# Patient Record
Sex: Male | Born: 1980
Health system: Southern US, Community
[De-identification: ages and names within clinical notes are randomized; demographics above are authoritative.]

## PROBLEM LIST (undated history)

## (undated) DIAGNOSIS — M109 Gout, unspecified: Secondary | ICD-10-CM

## (undated) DIAGNOSIS — I10 Essential (primary) hypertension: Secondary | ICD-10-CM

## (undated) DIAGNOSIS — E669 Obesity, unspecified: Secondary | ICD-10-CM

## (undated) HISTORY — PX: FRACTURE SURGERY: SHX138

## (undated) HISTORY — DX: Obesity, unspecified: E66.9

## (undated) HISTORY — DX: Gout, unspecified: M10.9

## (undated) HISTORY — DX: Essential (primary) hypertension: I10

---

## 2017-01-01 ENCOUNTER — Emergency Department (INDEPENDENT_AMBULATORY_CARE_PROVIDER_SITE_OTHER): Payer: BLUE CROSS/BLUE SHIELD

## 2017-01-01 ENCOUNTER — Emergency Department (INDEPENDENT_AMBULATORY_CARE_PROVIDER_SITE_OTHER)
Admission: EM | Admit: 2017-01-01 | Discharge: 2017-01-01 | Disposition: A | Discharge status: Home / Self Care | Payer: BLUE CROSS/BLUE SHIELD | Source: Home / Self Care | Attending: Family Medicine | Admitting: Family Medicine

## 2017-01-01 DIAGNOSIS — M79672 Pain in left foot: Secondary | ICD-10-CM | POA: Diagnosis not present

## 2017-01-01 LAB — URIC ACID: Uric Acid, Serum: 8.7 mg/dL — ABNORMAL HIGH (ref 4.0–8.0)

## 2017-01-01 MED ORDER — PREDNISONE 20 MG PO TABS: 20.0000 mg | ORAL_TABLET | Freq: Two times a day (BID) | ORAL | 0 refills | Status: DC

## 2017-01-01 NOTE — ED Provider Notes (Signed)
[facility]    CSN: [id number] Arrival date & time: 01/01/17  4098     History   Chief Complaint Chief Complaint  Patient presents with  . Foot Pain    left    HPI Andre Bradley is a 36 y.o. male.   Yesterday morning patient developed pain in left distal foot that has persisted.  He had been doing yard work the day before, but recalls no injury, and had no pain in his foot after doing yard work.  No history of gout.  No family history of gout.  He tried ibuprofen 400mg  twice daily with mild brief improvement.   The history is provided by the patient.  Foot Pain  This is a new problem. The current episode started yesterday. The problem occurs constantly. The problem has not changed since onset.Associated symptoms comments: none. The symptoms are aggravated by walking. Nothing relieves the symptoms. Treatments tried: Ibuprofen. The treatment provided mild relief.    History reviewed. No pertinent past medical history.  There are no active problems to display for this patient.   Past Surgical History:  Procedure Laterality Date  . FRACTURE SURGERY     left humerous, 1999       Home Medications    Prior to Admission medications   Not on File    Family History History reviewed. No pertinent family history.  Social History Social History  Substance Use Topics  . Smoking status: Current Every Day Smoker    Packs/day: 1.00  . Smokeless tobacco: Not on file  . Alcohol use Yes     Allergies   Patient has no known allergies.   Review of Systems Review of Systems  Constitutional: Negative for chills, fatigue and fever.  All other systems reviewed and are negative.    Physical Exam Triage Vital Signs ED Triage Vitals  Enc Vitals Group     BP 01/01/17 0849 (!) 175/112     Pulse Rate 01/01/17 0849 80     Resp --      Temp 01/01/17 0849 98.1 F (36.7 C)     Temp Source 01/01/17 0849 Oral     SpO2 01/01/17 0849 96 %     Weight 01/01/17 0850  239 lb (108.4 kg)     Height 01/01/17 0850 5\' 10"  (1.778 m)     Head Circumference --      Peak Flow --      Pain Score 01/01/17 0852 2     Pain Loc --      Pain Edu? --      Excl. in GC? --    No data found.   Updated Vital Signs BP (!) 175/112 (BP Location: Left Arm)   Pulse 80   Temp 98.1 F (36.7 C) (Oral)   Ht 5\' 10"  (1.778 m)   Wt 239 lb (108.4 kg)   SpO2 96%   BMI 34.29 kg/m   Visual Acuity Right Eye Distance:   Left Eye Distance:   Bilateral Distance:    Right Eye Near:   Left Eye Near:    Bilateral Near:     Physical Exam  Constitutional: He appears well-developed and well-nourished. No distress.  HENT:  Head: Normocephalic.  Eyes: Pupils are equal, round, and reactive to light.  Cardiovascular: Normal rate.   Pulmonary/Chest: Effort normal.  Musculoskeletal: He exhibits no edema.       Left foot: There is tenderness and bony tenderness. There is normal range of motion, no swelling,  normal capillary refill, no crepitus, no deformity and no laceration.       Feet:  Left foot has distinct tenderness to palpation over the first MTP joint extending into the distal first metatarsal.  No swelling.  Mild erythema and warmth dorsally.  No tenderness over extensor tendons.  Neurological: He is alert.  Skin: Skin is warm and dry.  Nursing note and vitals reviewed.    UC Treatments / Results  Labs (all labs ordered are listed, but only abnormal results are displayed) Labs Reviewed - No data to display  EKG  EKG Interpretation None       Radiology Dg Foot Complete Left  Result Date: 01/01/2017 CLINICAL DATA:  First metatarsal pain since yesterday. No known injury. Initial encounter. EXAM: LEFT FOOT - COMPLETE 3+ VIEW COMPARISON:  None. FINDINGS: There is no evidence of fracture or dislocation. There is no evidence of arthropathy or other focal bone abnormality. Small calcaneal spurs are seen Soft tissues are unremarkable. IMPRESSION: Negative exam.  Electronically Signed   By: Drusilla Kannerhomas  Dalessio M.D.   On: 01/01/2017 09:48    Procedures Procedures (including critical care time)  Medications Ordered in UC Medications - No data to display   Initial Impression / Assessment and Plan / UC Course  I have reviewed the triage vital signs and the nursing notes.  Pertinent labs & imaging results that were available during my care of the patient were reviewed by me and considered in my medical decision making (see chart for details).    Suspect acute gout.  Uric acid pending. Begin prednisone burst. Increase fluid intake.  May take Tylenol as needed for pain. Followup with Dr. Rodney Langtonhomas Thekkekandam or Dr. Clementeen GrahamEvan Corey ([facility]) if not improving about two weeks, or if symptoms recur. Note elevated BP today.  Recommend monitor blood pressure more frequently at different times of day and record on a calendar.   Followup with Family Doctor if BP remains elevated.    Final Clinical Impressions(s) / UC Diagnoses   Final diagnoses:  None    New Prescriptions New Prescriptions   No medications on file     Lattie HawStephen A Beese, MD 01/01/17 1303

## 2017-01-01 NOTE — Discharge Instructions (Signed)
Increase fluid intake.  May take Tylenol as needed for pain. 

## 2017-01-01 NOTE — ED Triage Notes (Signed)
Doing yard work Saturday, doesn't recall an injury, woke up Sunday morning with swelling, and pain in foot.  Woke with swelling and pain this am also.

## 2017-01-03 ENCOUNTER — Telehealth: Payer: Self-pay | Admitting: *Deleted

## 2017-01-03 NOTE — Telephone Encounter (Signed)
Called LM with Uric Acid results, complete Prednisone, and call back if he as any questions or concerns.

## 2017-03-30 ENCOUNTER — Encounter: Payer: Self-pay | Admitting: *Deleted

## 2017-03-30 ENCOUNTER — Emergency Department (INDEPENDENT_AMBULATORY_CARE_PROVIDER_SITE_OTHER)
Admission: EM | Admit: 2017-03-30 | Discharge: 2017-03-30 | Disposition: A | Discharge status: Home / Self Care | Payer: BLUE CROSS/BLUE SHIELD | Source: Home / Self Care | Attending: Family Medicine | Admitting: Family Medicine

## 2017-03-30 DIAGNOSIS — L739 Follicular disorder, unspecified: Secondary | ICD-10-CM

## 2017-03-30 MED ORDER — PREDNISONE 20 MG PO TABS: 20.0000 mg | ORAL_TABLET | Freq: Two times a day (BID) | ORAL | 0 refills | Status: DC

## 2017-03-30 MED ORDER — DOXYCYCLINE HYCLATE 100 MG PO CAPS: 100.0000 mg | ORAL_CAPSULE | Freq: Two times a day (BID) | ORAL | 0 refills | Status: DC

## 2017-03-30 NOTE — ED Triage Notes (Signed)
Patient c/o 3 days of pruritic rash to neck and chest now on legs. He did change his laundry detergent 4 days ago.

## 2017-03-30 NOTE — ED Provider Notes (Signed)
[facility]    CSN: [id number] Arrival date & time: 03/30/17  0802     History   Chief Complaint Chief Complaint  Patient presents with  . Rash    HPI Hart RobinsonsJoshua Pless is a 36 y.o. male.   Patient awoke 4 days ago and discovered a rash on his neck, chest, upper back, and a few spots on his upper legs.  The rash does not itch.  He feels well otherwise.  No recent hot tub use.   The history is provided by the patient.  Rash  Location: trunk, anterior neck and upper legs. Quality: dryness and redness   Quality: not blistering, not bruising, not burning, not draining, not itchy, not painful, not peeling, not scaling, not swelling and not weeping   Severity:  Mild Onset quality:  Sudden Duration:  4 days Timing:  Constant Progression:  Unchanged Chronicity:  New Context: new detergent/soap   Context: not animal contact, not chemical exposure, not exposure to similar rash, not food, not hot tub use, not insect bite/sting, not medications, not nuts, not plant contact, not pollen, not sick contacts and not sun exposure   Relieved by:  Nothing Worsened by:  Nothing Ineffective treatments:  None tried Associated symptoms: no abdominal pain, no diarrhea, no fatigue, no fever, no headaches, no hoarse voice, no induration, no joint pain, no myalgias, no nausea, no sore throat and no URI     History reviewed. No pertinent past medical history.  There are no active problems to display for this patient.   Past Surgical History:  Procedure Laterality Date  . FRACTURE SURGERY     left humerous, 1999       Home Medications    Prior to Admission medications   Medication Sig Start Date End Date Taking? Authorizing Provider  doxycycline (VIBRAMYCIN) 100 MG capsule Take 1 capsule (100 mg total) by mouth 2 (two) times daily. Take with food. 03/30/17   Lattie HawBeese, Montae Stager A, MD  predniSONE (DELTASONE) 20 MG tablet Take 1 tablet (20 mg total) by mouth 2 (two) times daily. Take with  food. 03/30/17   Lattie HawBeese, Lateef Juncaj A, MD    Family History History reviewed. No pertinent family history.  Social History Social History  Substance Use Topics  . Smoking status: Current Every Day Smoker    Packs/day: 1.00  . Smokeless tobacco: Never Used  . Alcohol use Yes     Allergies   Morphine and related   Review of Systems Review of Systems  Constitutional: Negative for fatigue and fever.  HENT: Negative for hoarse voice and sore throat.   Gastrointestinal: Negative for abdominal pain, diarrhea and nausea.  Musculoskeletal: Negative for arthralgias and myalgias.  Skin: Positive for rash.  Neurological: Negative for headaches.  All other systems reviewed and are negative.    Physical Exam Triage Vital Signs ED Triage Vitals  Enc Vitals Group     BP 03/30/17 0815 (!) 170/105     Pulse Rate 03/30/17 0815 90     Resp --      Temp 03/30/17 0815 98.4 F (36.9 C)     Temp Source 03/30/17 0815 Oral     SpO2 03/30/17 0815 97 %     Weight 03/30/17 0816 235 lb (106.6 kg)     Height --      Head Circumference --      Peak Flow --      Pain Score 03/30/17 0816 0     Pain Loc --  Pain Edu? --      Excl. in GC? --    No data found.   Updated Vital Signs BP (!) 167/107 (BP Location: Right Arm)   Pulse 90   Temp 98.4 F (36.9 C) (Oral)   Wt 235 lb (106.6 kg)   SpO2 97%   BMI 33.72 kg/m   Visual Acuity Right Eye Distance:   Left Eye Distance:   Bilateral Distance:    Right Eye Near:   Left Eye Near:    Bilateral Near:     Physical Exam  Constitutional: He appears well-developed and well-nourished. No distress.  HENT:  Head: Normocephalic.  Right Ear: External ear normal.  Left Ear: External ear normal.  Nose: Nose normal.  Mouth/Throat: Oropharynx is clear and moist.  Eyes: Conjunctivae are normal. Pupils are equal, round, and reactive to light.  Neck: Neck supple.  Cardiovascular: Normal heart sounds.   Pulmonary/Chest: Breath sounds normal.    Musculoskeletal: He exhibits no edema.  Lymphadenopathy:    He has no cervical adenopathy.  Neurological: He is alert.  Skin: Skin is warm and dry. Rash noted. Rash is pustular.     Anterior/posterior neck, upper torso, and anterior thighs have numerous tiny pustules centered on hair follicles.  Some lesions are suggestive of miliaria rubra.  Nursing note and vitals reviewed.    UC Treatments / Results  Labs (all labs ordered are listed, but only abnormal results are displayed) Labs Reviewed - No data to display  EKG  EKG Interpretation None       Radiology No results found.  Procedures Procedures (including critical care time)  Medications Ordered in UC Medications - No data to display   Initial Impression / Assessment and Plan / UC Course  I have reviewed the triage vital signs and the nursing notes.  Pertinent labs & imaging results that were available during my care of the patient were reviewed by me and considered in my medical decision making (see chart for details).    Begin doxycycline 100mg  BID for staph coverage, and prednisone burst. If rash itches, may take Benadryl at bedtime. Followup with dermatologist if not improved 7 to 10 days.  Note hypertension today. Advised to monitor blood pressure more frequently at different times of day and record on a calendar. Followup with Family Doctor if BP remains elevated.     Final Clinical Impressions(s) / UC Diagnoses   Final diagnoses:  Folliculitis    New Prescriptions Discharge Medication List as of 03/30/2017  8:33 AM    START taking these medications   Details  doxycycline (VIBRAMYCIN) 100 MG capsule Take 1 capsule (100 mg total) by mouth 2 (two) times daily. Take with food., Starting Fri 03/30/2017, Normal    predniSONE (DELTASONE) 20 MG tablet Take 1 tablet (20 mg total) by mouth 2 (two) times daily. Take with food., Starting Fri 03/30/2017, Normal         Lattie Haw, MD 03/30/17  [id number]

## 2017-03-30 NOTE — Discharge Instructions (Signed)
If rash itches, may take Benadryl at bedtime. 

## 2017-09-24 IMAGING — DX DG FOOT COMPLETE 3+V*L*
3 series · 3 of 3 positions shown · non-contrast
Comparison: None.

CLINICAL DATA: First metatarsal pain since yesterday. No known
injury. Initial encounter.

EXAM:
LEFT FOOT - COMPLETE 3+ VIEW

[foot ap]
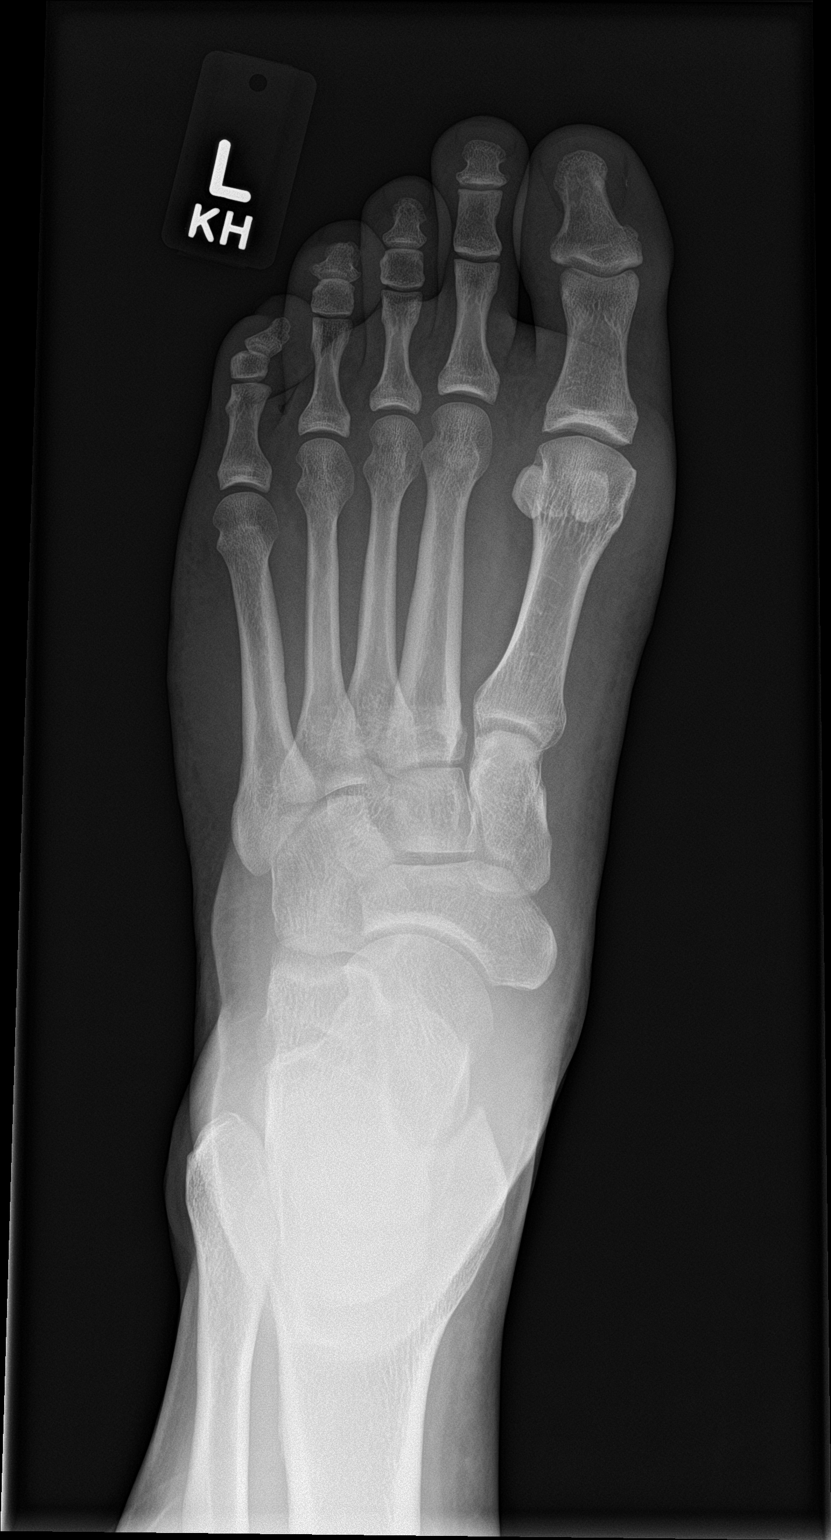

[foot obl]
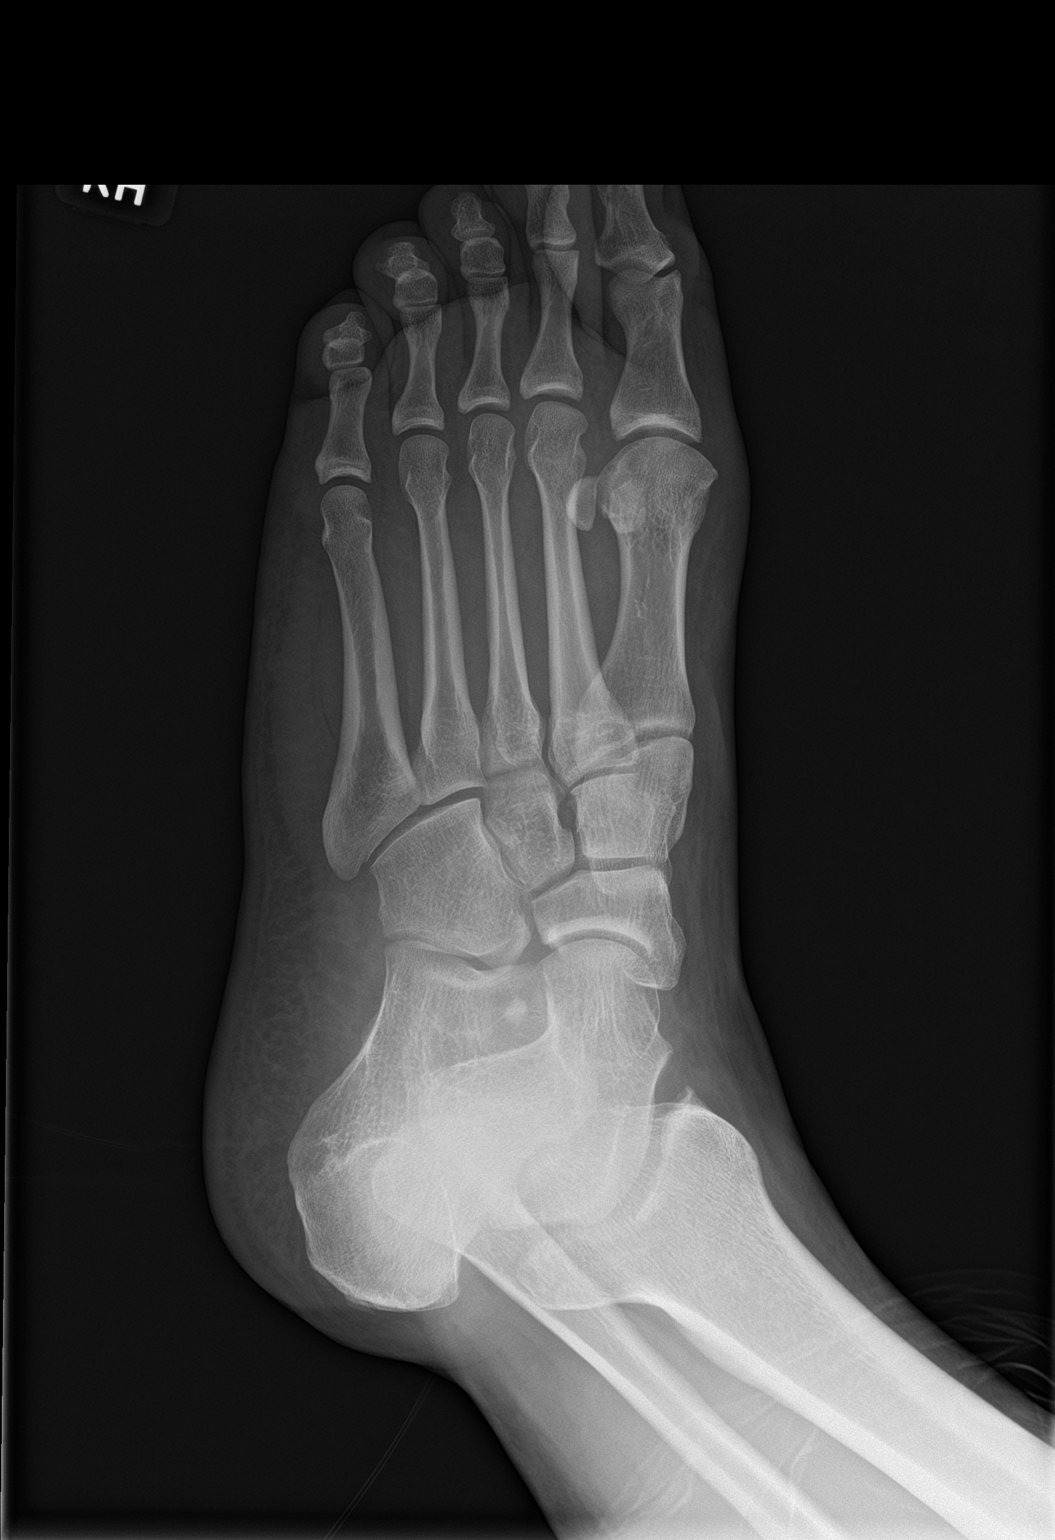

[foot lat]
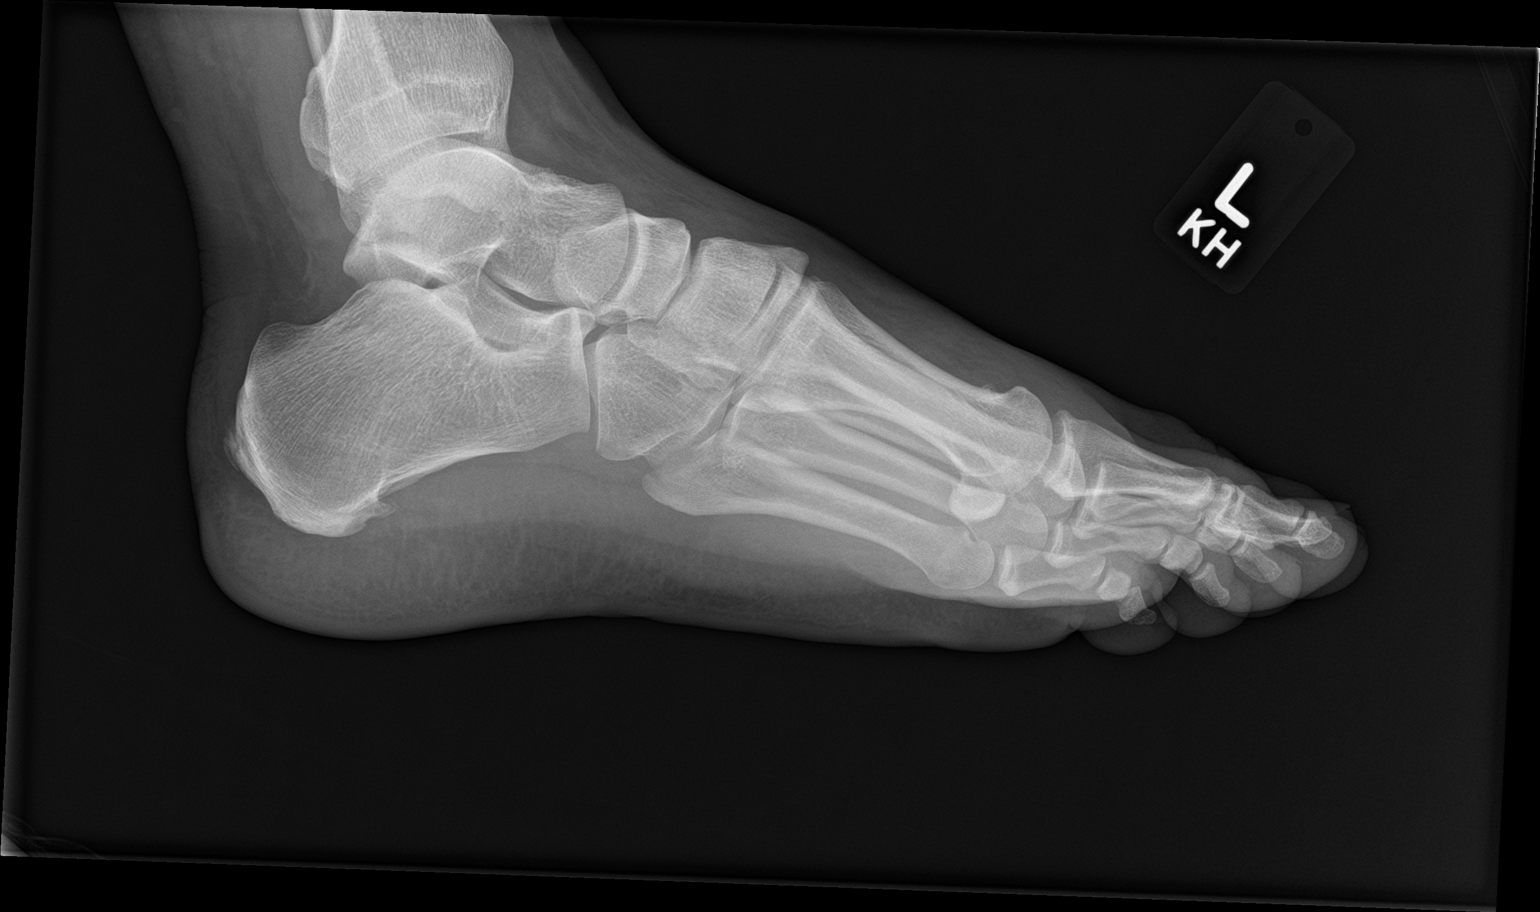

[3 of 3 positions shown; findings below may reference images not displayed]

FINDINGS: There is no evidence of fracture or dislocation. There is no
evidence of arthropathy or other focal bone abnormality. Small
calcaneal spurs are seen Soft tissues are unremarkable.
IMPRESSION: Negative exam.

## 2018-07-18 ENCOUNTER — Ambulatory Visit: Payer: BLUE CROSS/BLUE SHIELD | Admitting: Osteopathic Medicine

## 2018-07-23 ENCOUNTER — Encounter: Payer: Self-pay | Admitting: Physician Assistant

## 2018-07-23 ENCOUNTER — Ambulatory Visit (INDEPENDENT_AMBULATORY_CARE_PROVIDER_SITE_OTHER): Payer: BLUE CROSS/BLUE SHIELD | Admitting: Physician Assistant

## 2018-07-23 VITALS — BP 162/113 | HR 91 | Ht 71.0 in | Wt 243.0 lb

## 2018-07-23 DIAGNOSIS — Z0000 Encounter for general adult medical examination without abnormal findings: Secondary | ICD-10-CM | POA: Diagnosis not present

## 2018-07-23 DIAGNOSIS — Z1389 Encounter for screening for other disorder: Secondary | ICD-10-CM

## 2018-07-23 DIAGNOSIS — Z23 Encounter for immunization: Secondary | ICD-10-CM | POA: Diagnosis not present

## 2018-07-23 DIAGNOSIS — I10 Essential (primary) hypertension: Secondary | ICD-10-CM | POA: Diagnosis not present

## 2018-07-23 DIAGNOSIS — R809 Proteinuria, unspecified: Secondary | ICD-10-CM

## 2018-07-23 DIAGNOSIS — E782 Mixed hyperlipidemia: Secondary | ICD-10-CM

## 2018-07-23 DIAGNOSIS — Z8739 Personal history of other diseases of the musculoskeletal system and connective tissue: Secondary | ICD-10-CM

## 2018-07-23 DIAGNOSIS — Z13220 Encounter for screening for lipoid disorders: Secondary | ICD-10-CM | POA: Diagnosis not present

## 2018-07-23 DIAGNOSIS — E1169 Type 2 diabetes mellitus with other specified complication: Secondary | ICD-10-CM

## 2018-07-23 DIAGNOSIS — Z7689 Persons encountering health services in other specified circumstances: Secondary | ICD-10-CM

## 2018-07-23 DIAGNOSIS — Z131 Encounter for screening for diabetes mellitus: Secondary | ICD-10-CM

## 2018-07-23 DIAGNOSIS — E1165 Type 2 diabetes mellitus with hyperglycemia: Secondary | ICD-10-CM

## 2018-07-23 DIAGNOSIS — E66811 Obesity, class 1: Secondary | ICD-10-CM

## 2018-07-23 DIAGNOSIS — F17200 Nicotine dependence, unspecified, uncomplicated: Secondary | ICD-10-CM | POA: Diagnosis not present

## 2018-07-23 DIAGNOSIS — E6609 Other obesity due to excess calories: Secondary | ICD-10-CM | POA: Insufficient documentation

## 2018-07-23 DIAGNOSIS — R030 Elevated blood-pressure reading, without diagnosis of hypertension: Secondary | ICD-10-CM | POA: Insufficient documentation

## 2018-07-23 DIAGNOSIS — F17210 Nicotine dependence, cigarettes, uncomplicated: Secondary | ICD-10-CM

## 2018-07-23 DIAGNOSIS — Z130 Encounter for screening for diseases of the blood and blood-forming organs and certain disorders involving the immune mechanism: Secondary | ICD-10-CM | POA: Diagnosis not present

## 2018-07-23 MED ORDER — NICOTINE 21 MG/24HR TD PT24: 21.0000 mg | MEDICATED_PATCH | Freq: Every day | TRANSDERMAL | 1 refills | Status: DC

## 2018-07-23 NOTE — Progress Notes (Signed)
HPI:                                                                Andre Bradley is a 37 y.o. male who presents to [name]: [facility] today to establish care  Current concerns: general wellness  Reports he has been traveling a lot for work for several years and has not had the healthiest diet. He has gained weight and does not exercise regularly. He is interested in making lifestyle changes and wants to be cleared to start aerobic exercise. He has decreased exercise tolerance related to sedentary lifestyle, but is able to perform 4 METS without symptoms. Denies chest pain with exertion or syncope. No family hx of sudden cardiac death.  He is interested in smoking cessation. Currently smokes between 3 and 14 cigarettes on any given day. He was able to quit successfully using nicotine patch several years ago for 2 years. Cites increased travel/stress as reason for relapsing.   Depression screen PHQ 2/9 07/23/2018  Decreased Interest 0  Down, Depressed, Hopeless 0  PHQ - 2 Score 0    No flowsheet data found.    No past medical history on file. Past Surgical History:  Procedure Laterality Date  . FRACTURE SURGERY     left humerous, 1999   Social History   Tobacco Use  . Smoking status: Current Every Day Smoker    Packs/day: 1.00  . Smokeless tobacco: Never Used  Substance Use Topics  . Alcohol use: Yes   family history is not on file.    ROS: negative except as noted in the HPI  Medications: No current outpatient medications on file.   No current facility-administered medications for this visit.    Allergies  Allergen Reactions  . Morphine And Related Other (See Comments)       Objective:  BP (!) 171/97   Pulse 91   Ht 5\' 11"  (1.803 m)   Wt 243 lb (110.2 kg)   BMI 33.89 kg/m  Gen:  alert, not ill-appearing, no distress, appropriate for age, obese male HEENT: head normocephalic without obvious abnormality, conjunctiva  and cornea clear, trachea midline, no carotid bruit Pulm: Normal work of breathing, normal phonation, clear to auscultation bilaterally, no wheezes, rales or rhonchi CV: Normal rate, regular rhythm, s1 and s2 distinct, no murmurs, clicks or rubs  Neuro: alert and oriented x 3, no tremor MSK: extremities atraumatic, normal gait and station, no peripheral edema Skin: intact, no rashes on exposed skin, no jaundice, no cyanosis Psych: well-groomed, cooperative, good eye contact, euthymic mood, affect mood-congruent, speech is articulate, and thought processes clear and goal-directed  BP Readings from Last 3 Encounters:  07/23/18 (!) 162/113  03/30/17 (!) 167/107  01/01/17 (!) 175/112   Wt Readings from Last 3 Encounters:  07/23/18 243 lb (110.2 kg)  03/30/17 235 lb (106.6 kg)  01/01/17 239 lb (108.4 kg)     No results found for this or any previous visit (from the past 72 hour(s)). No results found.    Assessment and Plan: 37 y.o. male with   .Barrett was seen today for establish care.  Diagnoses and all orders for this visit:  Encounter to establish care  History of gout  Tobacco use disorder -  CBC  Uncontrolled stage 2 hypertension -     Comprehensive metabolic panel  Class 1 obesity due to excess calories in adult, unspecified BMI, unspecified whether serious comorbidity present -     Lipid Panel w/reflex Direct LDL  Screening for blood disease -     CBC -     Comprehensive metabolic panel  Screening for blood or protein in urine -     Urinalysis, Routine w reflex microscopic  Screening for diabetes mellitus -     Hemoglobin A1c  Screening for lipid disorders -     Lipid Panel w/reflex Direct LDL  Cigarette nicotine dependence without complication -     nicotine (NICODERM CQ) 21 mg/24hr patch; Place 1 patch (21 mg total) onto the skin daily.  Need for immunization against influenza -     Flu Vaccine QUAD 36+ mos IM  - Personally reviewed PMH, PSH,  PFH, medications, allergies, HM - Age-appropriate cancer screening: n/a - Influenza given today - Tdap UTD per patient - PHQ2 negative  Stage 2 HTN - asymptomatic - declines antihypertensive medication. Risks, benefits discussed - will give him 3 months for therapeutic lifestyle changes, but strongly recommend antihypertensive medication if BP remains elevated - counseled on DASH eating plan and regular aerobic exercise - patient to monitor and log BP's at home  Obesity - counseled on weight loss through decreased caloric intake, 2000 calorie moderate protein diet, and increase aerobic exercise  Smoking cessation The patient was counseled on the dangers of tobacco use, and was advised to quit and referred to a tobacco cessation program.  Reviewed strategies to maximize success, including written materials, local smoking cessation programs ( [place], [facility]) and pharmacotherapy (Nicoderm).  Patient education and anticipatory guidance given Patient agrees with treatment plan Follow-up in 3 months or sooner as needed if symptoms worsen or fail to improve  Levonne Hubert PA-C

## 2018-07-23 NOTE — Patient Instructions (Addendum)
For your blood pressure: - Goal <130/80 - monitor and log blood pressures at home - check around the same time each day in a relaxed setting - Limit salt to <2500 mg/day - Follow DASH eating plan - limit alcohol to 2 standard drinks per day for men and 1 per day for women - avoid tobacco products - weight loss: 7% of current body weight - follow-up every 6 months for your blood pressure   For weight loss: 2000 calories / day Use MyFitnessPal app to log daily intake of food, drink and exercise.  Make snacks high in protein (>10g) and low in carbs (<15g). Stay away from high sugar drinks and foods.  Consider getting a Education officer, museum or Garmin to track daily steps.  Aim for 10,000 steps per day.  Stay active! Try to work out 3-4 days per week for 30-45 minutes. Aim for 64 oz of water each day. Work on stress reduction, meal planning and 8 hours of sleep at night. Don't skip meals. Can substitute a protein drink for one meal per day    DASH Eating Plan DASH stands for "Dietary Approaches to Stop Hypertension." The DASH eating plan is a healthy eating plan that has been shown to reduce high blood pressure (hypertension). It may also reduce your risk for type 2 diabetes, heart disease, and stroke. The DASH eating plan may also help with weight loss. What are tips for following this plan? General guidelines  Avoid eating more than 2,300 mg (milligrams) of salt (sodium) a day. If you have hypertension, you may need to reduce your sodium intake to 1,500 mg a day.  Limit alcohol intake to no more than 1 drink a day for nonpregnant women and 2 drinks a day for men. One drink equals 12 oz of beer, 5 oz of wine, or 1 oz of hard liquor.  Work with your health care provider to maintain a healthy body weight or to lose weight. Ask what an ideal weight is for you.  Get at least 30 minutes of exercise that causes your heart to beat faster (aerobic exercise) most days of the week. Activities may include  walking, swimming, or biking.  Work with your health care provider or diet and nutrition specialist (dietitian) to adjust your eating plan to your individual calorie needs. Reading food labels  Check food labels for the amount of sodium per serving. Choose foods with less than 5 percent of the Daily Value of sodium. Generally, foods with less than 300 mg of sodium per serving fit into this eating plan.  To find whole grains, look for the word "whole" as the first word in the ingredient list. Shopping  Buy products labeled as "low-sodium" or "no salt added."  Buy fresh foods. Avoid canned foods and premade or frozen meals. Cooking  Avoid adding salt when cooking. Use salt-free seasonings or herbs instead of table salt or sea salt. Check with your health care provider or pharmacist before using salt substitutes.  Do not fry foods. Cook foods using healthy methods such as baking, boiling, grilling, and broiling instead.  Cook with heart-healthy oils, such as olive, canola, soybean, or sunflower oil. Meal planning   Eat a balanced diet that includes: ? 5 or more servings of fruits and vegetables each day. At each meal, try to fill half of your plate with fruits and vegetables. ? Up to 6-8 servings of whole grains each day. ? Less than 6 oz of lean meat, poultry, or fish each  day. A 3-oz serving of meat is about the same size as a deck of cards. One egg equals 1 oz. ? 2 servings of low-fat dairy each day. ? A serving of nuts, seeds, or beans 5 times each week. ? Heart-healthy fats. Healthy fats called Omega-3 fatty acids are found in foods such as flaxseeds and coldwater fish, like sardines, salmon, and mackerel.  Limit how much you eat of the following: ? Canned or prepackaged foods. ? Food that is high in trans fat, such as fried foods. ? Food that is high in saturated fat, such as fatty meat. ? Sweets, desserts, sugary drinks, and other foods with added sugar. ? Full-fat dairy  products.  Do not salt foods before eating.  Try to eat at least 2 vegetarian meals each week.  Eat more home-cooked food and less restaurant, buffet, and fast food.  When eating at a restaurant, ask that your food be prepared with less salt or no salt, if possible. What foods are recommended? The items listed may not be a complete list. Talk with your dietitian about what dietary choices are best for you. Grains Whole-grain or whole-wheat bread. Whole-grain or whole-wheat pasta. Brown rice. [place] Morrow. Bulgur. Whole-grain and low-sodium cereals. Pita bread. Low-fat, low-sodium crackers. Whole-wheat flour tortillas. Vegetables Fresh or frozen vegetables (raw, steamed, roasted, or grilled). Low-sodium or reduced-sodium tomato and vegetable juice. Low-sodium or reduced-sodium tomato sauce and tomato paste. Low-sodium or reduced-sodium canned vegetables. Fruits All fresh, dried, or frozen fruit. Canned fruit in natural juice (without added sugar). Meat and other protein foods Skinless chicken or [place]. Ground chicken or [place]. Pork with fat trimmed off. Fish and seafood. Egg whites. Dried beans, peas, or lentils. Unsalted nuts, nut butters, and seeds. Unsalted canned beans. Lean cuts of beef with fat trimmed off. Low-sodium, lean deli meat. Dairy Low-fat (1%) or fat-free (skim) milk. Fat-free, low-fat, or reduced-fat cheeses. Nonfat, low-sodium ricotta or cottage cheese. Low-fat or nonfat yogurt. Low-fat, low-sodium cheese. Fats and oils Soft margarine without trans fats. Vegetable oil. Low-fat, reduced-fat, or light mayonnaise and salad dressings (reduced-sodium). Canola, safflower, olive, soybean, and sunflower oils. Avocado. Seasoning and other foods Herbs. Spices. Seasoning mixes without salt. Unsalted popcorn and pretzels. Fat-free sweets. What foods are not recommended? The items listed may not be a complete list. Talk with your dietitian about what dietary choices are best for  you. Grains Baked goods made with fat, such as croissants, muffins, or some breads. Dry pasta or rice meal packs. Vegetables Creamed or fried vegetables. Vegetables in a cheese sauce. Regular canned vegetables (not low-sodium or reduced-sodium). Regular canned tomato sauce and paste (not low-sodium or reduced-sodium). Regular tomato and vegetable juice (not low-sodium or reduced-sodium). Angie Fava. Olives. Fruits Canned fruit in a light or heavy syrup. Fried fruit. Fruit in cream or butter sauce. Meat and other protein foods Fatty cuts of meat. Ribs. Fried meat. Berniece Salines. Sausage. Bologna and other processed lunch meats. Salami. Fatback. Hotdogs. Bratwurst. Salted nuts and seeds. Canned beans with added salt. Canned or smoked fish. Whole eggs or egg yolks. Chicken or [place] with skin. Dairy Whole or 2% milk, cream, and half-and-half. Whole or full-fat cream cheese. Whole-fat or sweetened yogurt. Full-fat cheese. Nondairy creamers. Whipped toppings. Processed cheese and cheese spreads. Fats and oils Butter. Stick margarine. Lard. Shortening. Ghee. Bacon fat. Tropical oils, such as coconut, palm kernel, or palm oil. Seasoning and other foods Salted popcorn and pretzels. Onion salt, garlic salt, seasoned salt, table salt, and sea salt. Worcestershire sauce.  Tartar sauce. Barbecue sauce. Teriyaki sauce. Soy sauce, including reduced-sodium. Steak sauce. Canned and packaged gravies. Fish sauce. Oyster sauce. Cocktail sauce. Horseradish that you find on the shelf. Ketchup. Mustard. Meat flavorings and tenderizers. Bouillon cubes. Hot sauce and Tabasco sauce. Premade or packaged marinades. Premade or packaged taco seasonings. Relishes. Regular salad dressings. Where to find more information:  National Heart, Lung, and Blood Institute: PopSteam.is  American Heart Association: www.heart.org Summary  The DASH eating plan is a healthy eating plan that has been shown to reduce high blood pressure  (hypertension). It may also reduce your risk for type 2 diabetes, heart disease, and stroke.  With the DASH eating plan, you should limit salt (sodium) intake to 2,300 mg a day. If you have hypertension, you may need to reduce your sodium intake to 1,500 mg a day.  When on the DASH eating plan, aim to eat more fresh fruits and vegetables, whole grains, lean proteins, low-fat dairy, and heart-healthy fats.  Work with your health care provider or diet and nutrition specialist (dietitian) to adjust your eating plan to your individual calorie needs. This information is not intended to replace advice given to you by your health care provider. Make sure you discuss any questions you have with your health care provider. Document Released: 09/28/2011 Document Revised: 10/02/2016 Document Reviewed: 10/02/2016 Elsevier Interactive Patient Education  2018 [employer].   Exercising to [employer] Exercising can help you to lose weight. In order to lose weight through exercise, you need to do vigorous-intensity exercise. You can tell that you are exercising with vigorous intensity if you are breathing very hard and fast and cannot hold a conversation while exercising. Moderate-intensity exercise helps to maintain your current weight. You can tell that you are exercising at a moderate level if you have a higher heart rate and faster breathing, but you are still able to hold a conversation. How often should I exercise? Choose an activity that you enjoy and set realistic goals. Your health care provider can help you to make an activity plan that works for you. Exercise regularly as directed by your health care provider. This may include:  Doing resistance training twice each week, such as: ? Push-ups. ? Sit-ups. ? Lifting weights. ? Using resistance bands.  Doing a given intensity of exercise for a given amount of time. Choose from these options: ? 150 minutes of moderate-intensity exercise every  week. ? 75 minutes of vigorous-intensity exercise every week. ? A mix of moderate-intensity and vigorous-intensity exercise every week.  Children, pregnant women, people who are out of shape, people who are overweight, and older adults may need to consult a health care provider for individual recommendations. If you have any sort of medical condition, be sure to consult your health care provider before starting a new exercise program. What are some activities that can help me to lose weight?  Walking at a rate of at least 4.5 miles an hour.  Jogging or running at a rate of 5 miles per hour.  Biking at a rate of at least 10 miles per hour.  Lap swimming.  Roller-skating or in-line skating.  Cross-country skiing.  Vigorous competitive sports, such as football, basketball, and soccer.  Jumping rope.  Aerobic dancing. How can I be more active in my day-to-day activities?  Use the stairs instead of the elevator.  Take a walk during your lunch break.  If you drive, park your car farther away from work or school.  If you take public  transportation, get off one stop early and walk the rest of the way.  Make all of your phone calls while standing up and walking around.  Get up, stretch, and walk around every 30 minutes throughout the day. What guidelines should I follow while exercising?  Do not exercise so much that you hurt yourself, feel dizzy, or get very short of breath.  Consult your health care provider prior to starting a new exercise program.  Wear comfortable clothes and shoes with good support.  Drink plenty of water while you exercise to prevent dehydration or heat stroke. Body water is lost during exercise and must be replaced.  Work out until you breathe faster and your heart beats faster. This information is not intended to replace advice given to you by your health care provider. Make sure you discuss any questions you have with your health care  provider. Document Released: 11/11/2010 Document Revised: 03/16/2016 Document Reviewed: 03/12/2014 Elsevier Interactive Patient Education  [employer].

## 2018-07-24 ENCOUNTER — Encounter: Payer: Self-pay | Admitting: Physician Assistant

## 2018-07-24 DIAGNOSIS — E781 Pure hyperglyceridemia: Secondary | ICD-10-CM | POA: Insufficient documentation

## 2018-07-24 DIAGNOSIS — R7401 Elevation of levels of liver transaminase levels: Secondary | ICD-10-CM | POA: Insufficient documentation

## 2018-07-24 DIAGNOSIS — E1165 Type 2 diabetes mellitus with hyperglycemia: Secondary | ICD-10-CM | POA: Insufficient documentation

## 2018-07-24 DIAGNOSIS — R809 Proteinuria, unspecified: Secondary | ICD-10-CM | POA: Insufficient documentation

## 2018-07-24 DIAGNOSIS — R740 Nonspecific elevation of levels of transaminase and lactic acid dehydrogenase [LDH]: Secondary | ICD-10-CM

## 2018-07-24 LAB — URINALYSIS, ROUTINE W REFLEX MICROSCOPIC
BILIRUBIN URINE: NEGATIVE
Bacteria, UA: NONE SEEN /HPF
Hgb urine dipstick: NEGATIVE
Hyaline Cast: NONE SEEN /LPF
LEUKOCYTES UA: NEGATIVE
NITRITE: NEGATIVE
PH: 6 (ref 5.0–8.0)
RBC / HPF: NONE SEEN /HPF (ref 0–2)
SPECIFIC GRAVITY, URINE: 1.018 (ref 1.001–1.03)
Squamous Epithelial / LPF: NONE SEEN /HPF (ref <=5)
WBC, UA: NONE SEEN /HPF (ref 0–5)

## 2018-07-24 LAB — COMPREHENSIVE METABOLIC PANEL
AG RATIO: 1.6 (calc) (ref 1.0–2.5)
ALT: 140 U/L — ABNORMAL HIGH (ref 9–46)
AST: 164 U/L — AB (ref 10–40)
Albumin: 4.9 g/dL (ref 3.6–5.1)
Alkaline phosphatase (APISO): 140 U/L — ABNORMAL HIGH (ref 40–115)
BUN / CREAT RATIO: 4 (calc) — AB (ref 6–22)
BUN: 4 mg/dL — ABNORMAL LOW (ref 7–25)
CHLORIDE: 97 mmol/L — AB (ref 98–110)
CO2: 25 mmol/L (ref 20–32)
Calcium: 9.6 mg/dL (ref 8.6–10.3)
Creat: 0.91 mg/dL (ref 0.60–1.35)
GLOBULIN: 3 g/dL (ref 1.9–3.7)
GLUCOSE: 185 mg/dL — AB (ref 65–99)
Potassium: 4.3 mmol/L (ref 3.5–5.3)
Sodium: 135 mmol/L (ref 135–146)
Total Bilirubin: 1 mg/dL (ref 0.2–1.2)
Total Protein: 7.9 g/dL (ref 6.1–8.1)

## 2018-07-24 LAB — CBC
HCT: 49.2 % (ref 38.5–50.0)
Hemoglobin: 17.3 g/dL — ABNORMAL HIGH (ref 13.2–17.1)
MCH: 34.6 pg — AB (ref 27.0–33.0)
MCHC: 35.2 g/dL (ref 32.0–36.0)
MCV: 98.4 fL (ref 80.0–100.0)
MPV: 11.3 fL (ref 7.5–12.5)
Platelets: 221 10*3/uL (ref 140–400)
RBC: 5 10*6/uL (ref 4.20–5.80)
RDW: 13.4 % (ref 11.0–15.0)
WBC: 7.7 10*3/uL (ref 3.8–10.8)

## 2018-07-24 LAB — LIPID PANEL W/REFLEX DIRECT LDL
CHOL/HDL RATIO: 7.2 (calc) — AB (ref <5.0)
Cholesterol: 195 mg/dL (ref <200)
HDL: 27 mg/dL — AB (ref >40)
NON-HDL CHOLESTEROL (CALC): 168 mg/dL — AB (ref <130)
Triglycerides: 403 mg/dL — ABNORMAL HIGH (ref <150)

## 2018-07-24 LAB — DIRECT LDL: Direct LDL: 78 mg/dL (ref <100)

## 2018-07-24 LAB — HEMOGLOBIN A1C
EAG (MMOL/L): 8.4 (calc)
HEMOGLOBIN A1C: 6.9 %{Hb} — AB (ref <5.7)
MEAN PLASMA GLUCOSE: 151 (calc)

## 2018-07-24 MED ORDER — ATORVASTATIN CALCIUM 20 MG PO TABS: 20.0000 mg | ORAL_TABLET | Freq: Every day | ORAL | 1 refills | Status: DC

## 2018-07-24 MED ORDER — METFORMIN HCL ER 500 MG PO TB24: ORAL_TABLET | ORAL | 3 refills | Status: DC

## 2018-07-24 MED ORDER — VALSARTAN 160 MG PO TABS: 160.0000 mg | ORAL_TABLET | Freq: Every day | ORAL | 3 refills | Status: DC

## 2018-07-24 NOTE — Addendum Note (Signed)
Addended by: Gena Fray E on: 07/24/2018 12:50 PM   Modules accepted: Orders

## 2018-07-24 NOTE — Progress Notes (Signed)
Good morning Andre Bradley,  I'd like you to schedule an appointment to review your lab results and discuss a treatment plan. I am going to be out of the office beginning Friday 10/4, so if you can't get in today or tomorrow to see me, you can see my supervising physician, Dr. Karie Schwalbe.  1. Your hemoglobin A1C shows that you have Type 2 Diabetes. I am placing a referral to the diabetes educator who will help you manage this with diet and lifestyle. I would also recommend starting low-dose Metformin, which will slow the progression of insulin resistance. 2. Your liver enzymes are high. This is called transaminitis. It is most commonly due to a condition called fatty liver disease. I would recommend avoiding alcohol, following a low-fat diet (such as DASH or Mediterranean), increasing your aerobic exercise, and weight loss. We should recheck these levels in 1 month 3. Your triglycerides (type of cholesterol) were also increased. I'm not sure if you were fasting when you had these labs drawn. Again, dietary modification and exercise are the treatment for this.  4. Lastly, there is protein in your urine, which means your high blood pressure combined with the diabetes is causing damage to your kidneys and causing them to leak protein. We should consider starting medication sooner rather than waiting 3 months, in order to protect your kidneys from further damage.

## 2018-07-26 ENCOUNTER — Encounter: Payer: Self-pay | Admitting: Sports Medicine

## 2018-07-30 ENCOUNTER — Encounter: Payer: Self-pay | Admitting: Sports Medicine

## 2018-07-30 ENCOUNTER — Ambulatory Visit (INDEPENDENT_AMBULATORY_CARE_PROVIDER_SITE_OTHER): Payer: BLUE CROSS/BLUE SHIELD | Admitting: Sports Medicine

## 2018-07-30 DIAGNOSIS — E6609 Other obesity due to excess calories: Secondary | ICD-10-CM | POA: Diagnosis not present

## 2018-07-30 MED ORDER — PHENTERMINE HCL 37.5 MG PO TABS: ORAL_TABLET | ORAL | 0 refills | Status: DC

## 2018-07-30 NOTE — Patient Instructions (Signed)
Get heart rate 150-1 60 and hold it there for 30 minutes 5 times per week.

## 2018-07-30 NOTE — Assessment & Plan Note (Signed)
With new diagnoses of diabetes, hyperlipidemia, essentially metabolic syndrome. I am going to help him with aggressive weight loss, nutrition referral, exercise prescription, starting phentermine. Return monthly for weight checks and refills, because he is [name] patient she will probably prefer nurse visit weight checks.

## 2018-07-30 NOTE — Progress Notes (Signed)
Subjective:    CC: Discussed some dietary strategies  HPI: Andre Bradley returns, he was recently diagnosed with diabetes and hyperlipidemia as well as transaminitis.  He would like to discuss dietary strategies to help lose weight and improve all of his comorbidities.  He is taking his medication as directed.  I reviewed the past medical history, family history, social history, surgical history, and allergies today and no changes were needed.  Please see the problem list section below in epic for further details.  Past Medical History: Past Medical History:  Diagnosis Date  . Gout   . Hypertension   . Obesity    Past Surgical History: Past Surgical History:  Procedure Laterality Date  . FRACTURE SURGERY     left humerous, 1999   Social History: Social History   Socioeconomic History  . Marital status: Single    Spouse name: Not on file  . Number of children: Not on file  . Years of education: Not on file  . Highest education level: Not on file  Occupational History  . Not on file  Social Needs  . Financial resource strain: Not on file  . Food insecurity:    Worry: Not on file    Inability: Not on file  . Transportation needs:    Medical: Not on file    Non-medical: Not on file  Tobacco Use  . Smoking status: Current Every Day Smoker    Packs/day: 0.75    Years: 15.00    Pack years: 11.25  . Smokeless tobacco: Never Used  Substance and Sexual Activity  . Alcohol use: Yes    Alcohol/week: 6.0 - 8.0 standard drinks    Types: 6 - 8 Standard drinks or equivalent per week  . Drug use: Never  . Sexual activity: Yes    Birth control/protection: None  Lifestyle  . Physical activity:    Days per week: Not on file    Minutes per session: Not on file  . Stress: Not on file  Relationships  . Social connections:    Talks on phone: Not on file    Gets together: Not on file    Attends religious service: Not on file    Active member of club or organization: Not on file    Attends meetings of clubs or organizations: Not on file    Relationship status: Not on file  Other Topics Concern  . Not on file  Social History Narrative  . Not on file   Family History: Family History  Family history unknown: Yes   Allergies: Allergies  Allergen Reactions  . Morphine And Related Other (See Comments)   Medications: See med rec.  Review of Systems: No fevers, chills, night sweats, weight loss, chest pain, or shortness of breath.   Objective:    General: Well Developed, well nourished, and in no acute distress.  Neuro: Alert and oriented x3, extra-ocular muscles intact, sensation grossly intact.  HEENT: Normocephalic, atraumatic, pupils equal round reactive to light, neck supple, no masses, no lymphadenopathy, thyroid nonpalpable.  Skin: Warm and dry, no rashes. Cardiac: Regular rate and rhythm, no murmurs rubs or gallops, no lower extremity edema.  Respiratory: Clear to auscultation bilaterally. Not using accessory muscles, speaking in full sentences.  Impression and Recommendations:    Class 1 obesity due to excess calories in adult With new diagnoses of diabetes, hyperlipidemia, essentially metabolic syndrome. I am going to help him with aggressive weight loss, nutrition referral, exercise prescription, starting phentermine. Return monthly for weight  checks and refills, because he is [name] patient she will probably prefer nurse visit weight checks.   I spent 25 minutes with this patient, greater than 50% was face-to-face time counseling regarding the above diagnoses,, specifically the multimodal approach for weight loss ___________________________________________ Ihor Austin. Benjamin Stain, M.D., ABFM., CAQSM. Primary Care and Sports Medicine [facility]  Adjunct Instructor of Family Medicine  [employer]

## 2018-08-12 DIAGNOSIS — E669 Obesity, unspecified: Secondary | ICD-10-CM | POA: Diagnosis not present

## 2018-08-27 ENCOUNTER — Other Ambulatory Visit: Payer: Self-pay | Admitting: Sports Medicine

## 2018-08-27 ENCOUNTER — Encounter: Payer: Self-pay | Admitting: Physician Assistant

## 2018-08-27 ENCOUNTER — Ambulatory Visit (INDEPENDENT_AMBULATORY_CARE_PROVIDER_SITE_OTHER): Payer: BLUE CROSS/BLUE SHIELD | Admitting: Physician Assistant

## 2018-08-27 VITALS — BP 138/76 | HR 72 | Wt 222.0 lb

## 2018-08-27 DIAGNOSIS — E6609 Other obesity due to excess calories: Secondary | ICD-10-CM

## 2018-08-27 DIAGNOSIS — I152 Hypertension secondary to endocrine disorders: Secondary | ICD-10-CM | POA: Insufficient documentation

## 2018-08-27 DIAGNOSIS — Z7689 Persons encountering health services in other specified circumstances: Secondary | ICD-10-CM | POA: Diagnosis not present

## 2018-08-27 DIAGNOSIS — I10 Essential (primary) hypertension: Secondary | ICD-10-CM | POA: Diagnosis not present

## 2018-08-27 DIAGNOSIS — E1159 Type 2 diabetes mellitus with other circulatory complications: Secondary | ICD-10-CM | POA: Diagnosis not present

## 2018-08-27 MED ORDER — PHENTERMINE HCL 37.5 MG PO TABS: ORAL_TABLET | ORAL | 0 refills | Status: DC

## 2018-08-27 NOTE — Progress Notes (Signed)
Patient is here for blood pressure and weight check. Denies any trouble sleeping, palpitations, or any other medication problems. Patient has lost weight.    Vitals:   08/27/18 0952 08/27/18 0956  BP: (!) 142/71 138/76  Pulse: 68 72   Wt Readings from Last 3 Encounters:  08/27/18 222 lb (100.7 kg)  07/30/18 236 lb (107 kg)  07/23/18 243 lb (110.2 kg)     A refill for Phentermine will be sent to Provider for review. Patient advised to schedule a four week nurse visit and keep upcoming appointment with PCP. Verbalized understanding, no further questions.

## 2018-09-23 ENCOUNTER — Ambulatory Visit (INDEPENDENT_AMBULATORY_CARE_PROVIDER_SITE_OTHER): Payer: BLUE CROSS/BLUE SHIELD | Admitting: Physician Assistant

## 2018-09-23 VITALS — BP 143/89 | HR 77 | Temp 97.4°F | Wt 216.5 lb

## 2018-09-23 DIAGNOSIS — E6609 Other obesity due to excess calories: Secondary | ICD-10-CM | POA: Diagnosis not present

## 2018-09-23 MED ORDER — PHENTERMINE HCL 15 MG PO CAPS: 15.0000 mg | ORAL_CAPSULE | ORAL | 0 refills | Status: DC

## 2018-09-23 NOTE — Progress Notes (Signed)
Pt in today for blood pressure and weight check. Patient denies trouble sleeping, palpitations or medication problems.   Vitals:   09/23/18 0829 09/23/18 0831  BP: (!) 146/73 (!) 143/89  Pulse: 80 77  Temp: (!) 97.4 F (36.3 C)    Wt Readings from Last 3 Encounters:  09/23/18 216 lb 8 oz (98.2 kg)  08/27/18 222 lb (100.7 kg)  07/30/18 236 lb (107 kg)    Per provider she will refill phentermine at a decreased dose. Pt aware and agreed. Pt advised to schedule a follow up with nurse/provider in 30 days.

## 2018-10-28 ENCOUNTER — Ambulatory Visit (INDEPENDENT_AMBULATORY_CARE_PROVIDER_SITE_OTHER): Payer: BLUE CROSS/BLUE SHIELD | Admitting: Physician Assistant

## 2018-10-28 ENCOUNTER — Encounter: Payer: Self-pay | Admitting: Physician Assistant

## 2018-10-28 VITALS — BP 138/78 | HR 76 | Wt 219.0 lb

## 2018-10-28 DIAGNOSIS — E781 Pure hyperglyceridemia: Secondary | ICD-10-CM | POA: Diagnosis not present

## 2018-10-28 DIAGNOSIS — I10 Essential (primary) hypertension: Secondary | ICD-10-CM | POA: Diagnosis not present

## 2018-10-28 DIAGNOSIS — R740 Nonspecific elevation of levels of transaminase and lactic acid dehydrogenase [LDH]: Secondary | ICD-10-CM

## 2018-10-28 DIAGNOSIS — R809 Proteinuria, unspecified: Secondary | ICD-10-CM | POA: Diagnosis not present

## 2018-10-28 DIAGNOSIS — E1159 Type 2 diabetes mellitus with other circulatory complications: Secondary | ICD-10-CM

## 2018-10-28 DIAGNOSIS — Z23 Encounter for immunization: Secondary | ICD-10-CM

## 2018-10-28 DIAGNOSIS — E1165 Type 2 diabetes mellitus with hyperglycemia: Secondary | ICD-10-CM

## 2018-10-28 DIAGNOSIS — E119 Type 2 diabetes mellitus without complications: Secondary | ICD-10-CM

## 2018-10-28 DIAGNOSIS — E6609 Other obesity due to excess calories: Secondary | ICD-10-CM | POA: Diagnosis not present

## 2018-10-28 DIAGNOSIS — I152 Hypertension secondary to endocrine disorders: Secondary | ICD-10-CM

## 2018-10-28 DIAGNOSIS — R7401 Elevation of levels of liver transaminase levels: Secondary | ICD-10-CM

## 2018-10-28 DIAGNOSIS — Z0100 Encounter for examination of eyes and vision without abnormal findings: Secondary | ICD-10-CM

## 2018-10-28 DIAGNOSIS — Z79899 Other long term (current) drug therapy: Secondary | ICD-10-CM | POA: Diagnosis not present

## 2018-10-28 LAB — POCT GLYCOSYLATED HEMOGLOBIN (HGB A1C): HEMOGLOBIN A1C: 5.6 % (ref 4.0–5.6)

## 2018-10-28 MED ORDER — VALSARTAN 160 MG PO TABS: 160.0000 mg | ORAL_TABLET | Freq: Every day | ORAL | 1 refills | Status: DC

## 2018-10-28 MED ORDER — METFORMIN HCL ER 500 MG PO TB24: 1000.0000 mg | ORAL_TABLET | Freq: Every day | ORAL | 1 refills | Status: DC

## 2018-10-28 NOTE — Progress Notes (Signed)
HPI:                                                                Andre Bradley is a 38 y.o. male who presents to [name] today for diabetes follow-up  DMII: taking Metformin XR 1000 mg daily. Compliant with medications.  Reports he has made a lot of changes to his diet and has been working on weight loss.  Recently had a bit of weight gain over the holiday. Denies polydipsia, polyuria, polyphagia. Denies blurred vision or vision change. Denies extremity pain, altered sensation and paresthesias.  Denies ulcers/wounds on feet. Hx of DKA/HHS: never Diabetes associated symptoms: none Glucometer: Not applicable Blood glucose readings: Not applicable   HTN: taking Valsartan 160 mg daily. Compliant with medications. Does not check BP's at home. Denies vision change, headache, chest pain with exertion, orthopnea, lightheadedness, syncope and edema. Risk factors include: DM2, obesity  Hyperlipidemia: Compliant with atorvastatin.  Denies adverse effects including myalgias  Past Medical History:  Diagnosis Date  . Gout   . Hypertension   . Obesity    Past Surgical History:  Procedure Laterality Date  . FRACTURE SURGERY     left humerous, 1999   Social History   Tobacco Use  . Smoking status: Former Smoker    Packs/day: 0.75    Years: 15.00    Pack years: 11.25    Last attempt to quit: 07/24/2018    Years since quitting: 0.2  . Smokeless tobacco: Never Used  Substance Use Topics  . Alcohol use: Yes    Alcohol/week: 6.0 - 8.0 standard drinks    Types: 6 - 8 Standard drinks or equivalent per week   Family history is unknown by patient.    ROS: negative except as noted in the HPI  Medications: Current Outpatient Medications  Medication Sig Dispense Refill  . atorvastatin (LIPITOR) 20 MG tablet Take 1 tablet (20 mg total) by mouth at bedtime. 90 tablet 1  . metFORMIN (GLUCOPHAGE XR) 500 MG 24 hr tablet Take 2 tablets (1,000  mg total) by mouth daily with supper. 180 tablet 1  . phentermine 15 MG capsule Take 1 capsule (15 mg total) by mouth every morning. 30 capsule 0  . valsartan (DIOVAN) 160 MG tablet Take 1 tablet (160 mg total) by mouth daily. 90 tablet 1   No current facility-administered medications for this visit.    Allergies  Allergen Reactions  . Morphine And Related Other (See Comments)       Objective:  BP 138/78   Pulse 76   Wt 219 lb (99.3 kg)   BMI 30.54 kg/m  Gen:  alert, not ill-appearing, no distress, appropriate for age, obese male HEENT: head normocephalic without obvious abnormality, conjunctiva and cornea clear, trachea midline Pulm: Normal work of breathing, normal phonation, clear to auscultation bilaterally  Neuro: alert and oriented x 3, no tremor MSK: extremities atraumatic, normal gait and station Skin: intact, no rashes on exposed skin, no jaundice, no cyanosis Psych: well-groomed, cooperative, good eye contact, euthymic mood, affect mood-congruent, speech is articulate, and thought processes clear and goal-directed  Diabetic Foot Exam - Simple   Simple Foot Form Diabetic Foot exam was performed with the following findings:  Yes 10/28/2018  8:19 AM  Visual Inspection See comments:  Yes Sensation Testing Intact to touch and monofilament testing bilaterally:  Yes Pulse Check Posterior Tibialis and Dorsalis pulse intact bilaterally:  Yes Comments Mild callus formation and interdigital superficial skin breakdown      Lab Results  Component Value Date   CREATININE 0.91 07/23/2018   BUN 4 (L) 07/23/2018   NA 135 07/23/2018   K 4.3 07/23/2018   CL 97 (L) 07/23/2018   CO2 25 07/23/2018   Lab Results  Component Value Date   ALT 140 (H) 07/23/2018   AST 164 (H) 07/23/2018   BILITOT 1.0 07/23/2018   Lab Results  Component Value Date   HGBA1C 5.6 10/28/2018   Lab Results  Component Value Date   CHOL 195 07/23/2018   HDL 27 (L) 07/23/2018   LDLCALC   07/23/2018     Comment:     . LDL cholesterol not calculated. Triglyceride levels greater than 400 mg/dL invalidate calculated LDL results. . Reference range: <100 . Desirable range <100 mg/dL for primary prevention;   <70 mg/dL for patients with CHD or diabetic patients  with > or = 2 CHD risk factors. [name]. LDL-C is now calculated using the Martin-Hopkins  calculation, which is a validated novel method providing  better accuracy than the Friedewald equation in the  estimation of LDL-C.  Horald PollenMartin SS et al. Lenox AhrJAMA. 1610;960(452013;310(19): 2061-2068  (http://education.[employer].com/faq/FAQ164)    LDLDIRECT 78 07/23/2018   TRIG 403 (H) 07/23/2018   CHOLHDL 7.2 (H) 07/23/2018   BP Readings from Last 3 Encounters:  10/28/18 138/78  09/23/18 (!) 143/89  08/27/18 138/76   Wt Readings from Last 3 Encounters:  10/28/18 219 lb (99.3 kg)  09/23/18 216 lb 8 oz (98.2 kg)  08/27/18 222 lb (100.7 kg)     Results for orders placed or performed in visit on 10/28/18 (from the past 72 hour(s))  POCT HgB A1C     Status: Normal   Collection Time: 10/28/18  8:22 AM  Result Value Ref Range   Hemoglobin A1C     HbA1c POC (<> result, manual entry) 5.6 4.0 - 5.6 %   HbA1c, POC (prediabetic range)     HbA1c, POC (controlled diabetic range)     No results found.    Assessment and Plan: 38 y.o. male with   .[name] was seen today for weight check.  Diagnoses and all orders for this visit:  Encounter for medication management  Class 1 obesity due to excess calories in adult, unspecified BMI, unspecified whether serious comorbidity present  Type 2 diabetes mellitus with hyperglycemia, without long-term current use of insulin (HCC) -     POCT HgB A1C -     Ambulatory referral to Ophthalmology -     metFORMIN (GLUCOPHAGE XR) 500 MG 24 hr tablet; Take 2 tablets (1,000 mg total) by mouth daily with supper. -     Pneumococcal polysaccharide vaccine 23-valent greater than or equal to 2yo  subcutaneous/IM  Diabetic eye exam (HCC) -     Ambulatory referral to Ophthalmology  Hypertension associated with diabetes (HCC)  Uncontrolled stage 2 hypertension -     valsartan (DIOVAN) 160 MG tablet; Take 1 tablet (160 mg total) by mouth daily.  Hypertriglyceridemia -     Lipid Panel w/reflex Direct LDL  Transaminitis -     Hepatic function panel  Proteinuria, unspecified type -     Urinalysis w microscopic + reflex cultur   Type 2 diabetes - well controlled, A1c  less than 6 - Applauded for lifestyle changes - cont Metformin XR 1000 mg QD -BP goal less than 130/80, continue lisinopril -LDL goal less than 70, unable to calculate due to hypertriglyceridemia.  Continue atorvastatin, recheck fasting lipids today - foot exam performed today and normal - pneumovax given - referral placed for eye exam - rechecking triglycerides  Elevated liver enzymes Rechecking hepatic function panel today Counseled on eating plan for nonalcoholic fatty liver If persistent transaminitis today, will obtain ultrasound and acute hepatitis panel   Elevated blood pressure reading Recheck blood pressure was improved but still in the stage I hypertensive range I have discontinued phentermine for now I would like him to monitor and log his blood pressures at home and send me his readings in 1 to 2 weeks When blood pressure is controlled we can revisit low-dose phentermine for weight loss  Patient education and anticipatory guidance given Patient agrees with treatment plan Follow-up in 3 months for hypertension and routine medication management or sooner as needed if symptoms worsen or fail to improve  Levonne Hubertharley E. Adasha Boehme PA-C

## 2018-10-28 NOTE — Patient Instructions (Signed)
Diabetes Preventive Care: - annual foot exam  - annual dilated eye exam with an eye doctor - self foot exams at least weekly - pneumonia vaccine once (booster in 5 years and at age 38) - annual influenza vaccine - twice yearly dental cleanings and yearly exam - goal blood pressure <140/90, ideally <130/80 - LDL cholesterol <70 - A1C <0.8 - body mass index (BMI) <25.0 - follow-up every 3 months if your A1C is not at goal - follow-up every 6 months if diabetes is well controlled  Nonalcoholic Fatty Liver Disease Diet Nonalcoholic fatty liver disease is a condition that causes fat to accumulate in and around the liver. The disease makes it harder for the liver to work the way that it should. Following a healthy diet can help to keep nonalcoholic fatty liver disease under control. It can also help to prevent or improve conditions that are associated with the disease, such as heart disease, diabetes, high blood pressure, and abnormal cholesterol levels. Along with regular exercise, this diet:  Promotes weight loss.  Helps to control blood sugar levels.  Helps to improve the way that the body uses insulin. What do I need to know about this diet?  Use the glycemic index (GI) to plan your meals. The index tells you how quickly a food will raise your blood sugar. Choose low-GI foods. These foods take a longer time to raise blood sugar.  Keep track of how many calories you take in. Eating the right amount of calories will help you to achieve a healthy weight.  You may want to follow a Mediterranean diet. This diet includes a lot of vegetables, lean meats or fish, whole grains, fruits, and healthy oils and fats. What foods can I eat? Grains Whole grains, such as whole-wheat or whole-grain breads, crackers, tortillas, cereals, and pasta. Stone-ground whole wheat. Pumpernickel bread. Unsweetened oatmeal. Bulgur. Barley. Quinoa. Brown or wild rice. Corn or whole-wheat flour  tortillas. Vegetables Lettuce. Spinach. Peas. Beets. Cauliflower. Cabbage. Broccoli. Carrots. Tomatoes. Squash. Eggplant. Herbs. Peppers. Onions. Cucumbers. Brussels sprouts. Yams and sweet potatoes. Beans. Lentils. Fruits Bananas. Apples. Oranges. Grapes. Papaya. Mango. Pomegranate. Kiwi. Grapefruit. Cherries. Meats and Other Protein Sources Seafood and shellfish. Lean meats. Poultry. Tofu. Dairy Low-fat or fat-free dairy products, such as yogurt, cottage cheese, and cheese. Beverages Water. Sugar-free drinks. Tea. Coffee. Low-fat or skim milk. Milk alternatives, such as soy or almond milk. Real fruit juice. Condiments Mustard. Relish. Low-fat, low-sugar ketchup and barbecue sauce. Low-fat or fat-free mayonnaise. Sweets and Desserts Sugar-free sweets. Fats and Oils Avocado. Canola or olive oil. Nuts and nut butters. Seeds. The items listed above may not be a complete list of recommended foods or beverages. Contact your dietitian for more options. What foods are not recommended? Palm oil and coconut oil. Processed foods. Fried foods. Sweetened drinks, such as sweet tea, milkshakes, snow cones, iced sweet drinks, and sodas. Alcohol. Sweets. Foods that contain a lot of salt or sodium. The items listed above may not be a complete list of foods and beverages to avoid. Contact your dietitian for more information. This information is not intended to replace advice given to you by your health care provider. Make sure you discuss any questions you have with your health care provider. Document Released: 02/23/2015 Document Revised: 03/16/2016 Document Reviewed: 11/03/2014 Elsevier Interactive Patient Education  2019 [employer].

## 2018-10-29 LAB — URINALYSIS W MICROSCOPIC + REFLEX CULTURE
BACTERIA UA: NONE SEEN /HPF
Bilirubin Urine: NEGATIVE
Glucose, UA: NEGATIVE
HGB URINE DIPSTICK: NEGATIVE
Hyaline Cast: NONE SEEN /LPF
Leukocyte Esterase: NEGATIVE
Nitrites, Initial: NEGATIVE
RBC / HPF: NONE SEEN /HPF (ref 0–2)
SPECIFIC GRAVITY, URINE: 1.022 (ref 1.001–1.03)
SQUAMOUS EPITHELIAL / LPF: NONE SEEN /HPF (ref <=5)
WBC, UA: NONE SEEN /HPF (ref 0–5)
pH: 6 (ref 5.0–8.0)

## 2018-10-29 LAB — LIPID PANEL W/REFLEX DIRECT LDL
CHOLESTEROL: 169 mg/dL (ref <200)
HDL: 50 mg/dL (ref >40)
LDL CHOLESTEROL (CALC): 91 mg/dL
Non-HDL Cholesterol (Calc): 119 mg/dL (calc) (ref <130)
TRIGLYCERIDES: 188 mg/dL — AB (ref <150)
Total CHOL/HDL Ratio: 3.4 (calc) (ref <5.0)

## 2018-10-29 LAB — HEPATIC FUNCTION PANEL
AG RATIO: 2 (calc) (ref 1.0–2.5)
ALKALINE PHOSPHATASE (APISO): 86 U/L (ref 40–115)
ALT: 111 U/L — AB (ref 9–46)
AST: 79 U/L — ABNORMAL HIGH (ref 10–40)
Albumin: 5 g/dL (ref 3.6–5.1)
BILIRUBIN DIRECT: 0.2 mg/dL (ref 0.0–0.2)
BILIRUBIN TOTAL: 0.8 mg/dL (ref 0.2–1.2)
Globulin: 2.5 g/dL (calc) (ref 1.9–3.7)
Indirect Bilirubin: 0.6 mg/dL (calc) (ref 0.2–1.2)
Total Protein: 7.5 g/dL (ref 6.1–8.1)

## 2018-10-29 LAB — NO CULTURE INDICATED

## 2018-10-30 NOTE — Addendum Note (Signed)
Addended by: Gena Fray E on: 10/30/2018 09:07 AM   Modules accepted: Orders

## 2018-11-28 ENCOUNTER — Other Ambulatory Visit: Payer: Self-pay | Admitting: Physician Assistant

## 2018-11-28 DIAGNOSIS — I10 Essential (primary) hypertension: Secondary | ICD-10-CM

## 2019-01-17 ENCOUNTER — Other Ambulatory Visit: Payer: Self-pay | Admitting: Physician Assistant

## 2019-01-17 DIAGNOSIS — E782 Mixed hyperlipidemia: Secondary | ICD-10-CM

## 2019-01-17 DIAGNOSIS — E1169 Type 2 diabetes mellitus with other specified complication: Secondary | ICD-10-CM

## 2019-01-27 ENCOUNTER — Ambulatory Visit: Payer: BLUE CROSS/BLUE SHIELD | Admitting: Physician Assistant

## 2019-02-23 ENCOUNTER — Other Ambulatory Visit: Payer: Self-pay | Admitting: Physician Assistant

## 2019-02-23 DIAGNOSIS — I10 Essential (primary) hypertension: Secondary | ICD-10-CM

## 2019-02-26 ENCOUNTER — Encounter: Payer: Self-pay | Admitting: Physician Assistant

## 2019-02-26 ENCOUNTER — Other Ambulatory Visit: Payer: Self-pay | Admitting: Physician Assistant

## 2019-02-26 DIAGNOSIS — I10 Essential (primary) hypertension: Secondary | ICD-10-CM

## 2019-02-26 DIAGNOSIS — E1165 Type 2 diabetes mellitus with hyperglycemia: Secondary | ICD-10-CM

## 2019-02-27 MED ORDER — METFORMIN HCL ER 500 MG PO TB24: 1000.0000 mg | ORAL_TABLET | Freq: Every day | ORAL | 0 refills | Status: DC

## 2019-02-27 MED ORDER — TELMISARTAN 40 MG PO TABS: 40.0000 mg | ORAL_TABLET | Freq: Every day | ORAL | 0 refills | Status: DC

## 2019-06-15 ENCOUNTER — Other Ambulatory Visit: Payer: Self-pay | Admitting: Physician Assistant

## 2019-06-15 DIAGNOSIS — I10 Essential (primary) hypertension: Secondary | ICD-10-CM

## 2019-07-13 ENCOUNTER — Other Ambulatory Visit: Payer: Self-pay | Admitting: Physician Assistant

## 2019-07-15 ENCOUNTER — Other Ambulatory Visit: Payer: Self-pay | Admitting: Physician Assistant

## 2019-07-15 DIAGNOSIS — I10 Essential (primary) hypertension: Secondary | ICD-10-CM

## 2019-07-28 ENCOUNTER — Other Ambulatory Visit: Payer: Self-pay | Admitting: Sports Medicine

## 2019-07-28 DIAGNOSIS — I10 Essential (primary) hypertension: Secondary | ICD-10-CM

## 2019-08-11 ENCOUNTER — Other Ambulatory Visit: Payer: Self-pay | Admitting: Physician Assistant

## 2019-08-11 DIAGNOSIS — E1165 Type 2 diabetes mellitus with hyperglycemia: Secondary | ICD-10-CM

## 2019-08-28 ENCOUNTER — Encounter: Payer: Self-pay | Admitting: Physician Assistant

## 2019-08-28 ENCOUNTER — Other Ambulatory Visit: Payer: Self-pay | Admitting: Physician Assistant

## 2019-08-28 DIAGNOSIS — I10 Essential (primary) hypertension: Secondary | ICD-10-CM

## 2019-08-28 DIAGNOSIS — E1165 Type 2 diabetes mellitus with hyperglycemia: Secondary | ICD-10-CM

## 2019-08-28 MED ORDER — METFORMIN HCL ER 500 MG PO TB24: 1000.0000 mg | ORAL_TABLET | Freq: Every day | ORAL | 0 refills | Status: DC

## 2019-08-28 MED ORDER — TELMISARTAN 40 MG PO TABS: 40.0000 mg | ORAL_TABLET | Freq: Every day | ORAL | 0 refills | Status: DC

## 2019-09-17 ENCOUNTER — Ambulatory Visit (INDEPENDENT_AMBULATORY_CARE_PROVIDER_SITE_OTHER): Payer: BC Managed Care – PPO | Admitting: Osteopathic Medicine

## 2019-09-17 ENCOUNTER — Encounter: Payer: Self-pay | Admitting: Osteopathic Medicine

## 2019-09-17 ENCOUNTER — Other Ambulatory Visit: Payer: Self-pay

## 2019-09-17 VITALS — BP 147/81 | HR 82 | Temp 98.1°F | Wt 213.0 lb

## 2019-09-17 DIAGNOSIS — I152 Hypertension secondary to endocrine disorders: Secondary | ICD-10-CM

## 2019-09-17 DIAGNOSIS — E1165 Type 2 diabetes mellitus with hyperglycemia: Secondary | ICD-10-CM

## 2019-09-17 DIAGNOSIS — E782 Mixed hyperlipidemia: Secondary | ICD-10-CM

## 2019-09-17 DIAGNOSIS — R809 Proteinuria, unspecified: Secondary | ICD-10-CM

## 2019-09-17 DIAGNOSIS — Z8739 Personal history of other diseases of the musculoskeletal system and connective tissue: Secondary | ICD-10-CM | POA: Diagnosis not present

## 2019-09-17 DIAGNOSIS — Z0000 Encounter for general adult medical examination without abnormal findings: Secondary | ICD-10-CM

## 2019-09-17 DIAGNOSIS — E1159 Type 2 diabetes mellitus with other circulatory complications: Secondary | ICD-10-CM | POA: Diagnosis not present

## 2019-09-17 DIAGNOSIS — I10 Essential (primary) hypertension: Secondary | ICD-10-CM

## 2019-09-17 DIAGNOSIS — E1169 Type 2 diabetes mellitus with other specified complication: Secondary | ICD-10-CM

## 2019-09-17 LAB — POCT GLYCOSYLATED HEMOGLOBIN (HGB A1C): Hemoglobin A1C: 5.2 % (ref 4.0–5.6)

## 2019-09-17 MED ORDER — TELMISARTAN 40 MG PO TABS: 40.0000 mg | ORAL_TABLET | Freq: Every day | ORAL | 3 refills | Status: DC

## 2019-09-17 MED ORDER — ATORVASTATIN CALCIUM 20 MG PO TABS: 20.0000 mg | ORAL_TABLET | Freq: Every day | ORAL | 3 refills | Status: DC

## 2019-09-17 MED ORDER — METFORMIN HCL ER 500 MG PO TB24: 1000.0000 mg | ORAL_TABLET | Freq: Every day | ORAL | 3 refills | Status: DC

## 2019-09-17 NOTE — Progress Notes (Signed)
HPI: Andre Bradley is a 38 y.o. male who  has a past medical history of Gout, Hypertension, and Obesity.  he presents to [facility] today, 09/17/19,  for chief complaint of:  DM2 follow-up    DIABETES SCREENING/PREVENTIVE CARE: A1C past 3-6 mos: No    10/28/18: 5.6%  09/17/19:   BP goal <130/80: No  - reports home BP 140/90+  BP Readings from Last 3 Encounters:  09/17/19 (!) 162/83, rechecked and 147/81  10/28/18 138/78  09/23/18 (!) 143/89   LDL goal <70: No  Eye exam annually: none on file, importance discussed with patient Foot exam: 10/2018 Microalbuminuria:n/a on ACE/ARB Metformin: Yes  ACE/ARB: Yes  Antiplatelet if ASCVD Risk >10%: n/a Statin: Yes  Pneumovax: Yes  Immunization History  Administered Date(s) Administered  . Influenza,inj,Quad PF,6+ Mos 07/23/2018  . Influenza-Unspecified 07/23/2019  . Pneumococcal Polysaccharide-23 10/28/2018        At today's visit 09/17/19 ... PMH, PSH, FH reviewed and updated as needed.  Current medication list and allergy/intolerance hx reviewed and updated as needed. (See remainder of HPI, ROS, Phys Exam below)   No results found.  No results found for this or any previous visit (from the past 72 hour(s)).        ASSESSMENT/PLAN: The primary encounter diagnosis was Type 2 diabetes mellitus with hyperglycemia, without long-term current use of insulin (HCC). Diagnoses of Hypertension associated with diabetes (HCC), History of gout, Proteinuria, unspecified type, Uncontrolled stage 2 hypertension, Dyslipidemia with low high density lipoprotein (HDL) cholesterol with hypertriglyceridemia due to type 2 diabetes mellitus (HCC), and Annual physical exam were also pertinent to this visit.   Labs ordered for future visit. Annual physical / preventive care was NOT performed or billed today.   Reminder set for pt to update [place] on BP, goal 130/80. Take meds in evenings and see how it goes,  may need to adjsut!   Orders Placed This Encounter  Procedures  . CBC  . COMPLETE METABOLIC PANEL WITH GFR  . LIPID SCREENING  . A1C  . Uric acid  . POCT HgB A1C     Meds ordered this encounter  Medications  . telmisartan (MICARDIS) 40 MG tablet    Sig: Take 1 tablet (40 mg total) by mouth daily.    Dispense:  90 tablet    Refill:  3  . metFORMIN (GLUCOPHAGE XR) 500 MG 24 hr tablet    Sig: Take 2 tablets (1,000 mg total) by mouth daily with supper.    Dispense:  180 tablet    Refill:  3  . atorvastatin (LIPITOR) 20 MG tablet    Sig: Take 1 tablet (20 mg total) by mouth daily.    Dispense:  90 tablet    Refill:  3    There are no Patient Instructions on file for this visit.    Follow-up plan: Return in about 6 months (around 03/16/2020) for [place] (get labs prior to visit, orders are in).                                                 ################################################# ################################################# ################################################# #################################################    Current Meds  Medication Sig  . atorvastatin (LIPITOR) 20 MG tablet TAKE 1 TABLET BY MOUTH EVERYDAY AT BEDTIME  . metFORMIN (GLUCOPHAGE XR) 500 MG 24 hr tablet Take 2 tablets (  1,000 mg total) by mouth daily with supper.  . telmisartan (MICARDIS) 40 MG tablet Take 1 tablet (40 mg total) by mouth daily.    Allergies  Allergen Reactions  . Morphine And Related Other (See Comments)       Review of Systems:  Constitutional: No recent illness  Cardiac: No  chest pain, No  pressure, No palpitations  Respiratory:  No  shortness of breath. No  Cough  Gastrointestinal: No  abdominal pain  Musculoskeletal: No new myalgia/arthralgia  Neurologic: No  weakness, No  Dizziness  Psychiatric: No  concerns with depression, No  concerns with anxiety  Exam:  BP (!) 162/83 (BP Location: Right  Arm, Patient Position: Sitting, Cuff Size: Normal)   Pulse 76   Temp 98.1 F (36.7 C) (Oral)   Wt 213 lb 0.6 oz (96.6 kg)   BMI 29.71 kg/m   Constitutional: VS see above. General Appearance: alert, well-developed, well-nourished, NAD  Neck: No masses, trachea midline.   Respiratory: Normal respiratory effort. no wheeze, no rhonchi, no rales  Cardiovascular: S1/S2 normal, no murmur, no rub/gallop auscultated. RRR.   Musculoskeletal: Gait normal. Symmetric and independent movement of all extremities  Neurological: Normal balance/coordination. No tremor.  Skin: warm, dry, intact.   Psychiatric: Normal judgment/insight. Normal mood and affect. Oriented x3.       Visit summary with medication list and pertinent instructions was printed for patient to review, patient was advised to alert [place] if any updates are needed. All questions at time of visit were answered - patient instructed to contact office with any additional concerns. ER/RTC precautions were reviewed with the patient and understanding verbalized.   Note: Total time spent 25 minutes, greater than 50% of the visit was spent face-to-face counseling and coordinating care for the following: The primary encounter diagnosis was Type 2 diabetes mellitus with hyperglycemia, without long-term current use of insulin ([name]). Diagnoses of Hypertension associated with diabetes ([name]), History of gout, Proteinuria, unspecified type, Uncontrolled stage 2 hypertension, Dyslipidemia with low high density lipoprotein (HDL) cholesterol with hypertriglyceridemia due to type 2 diabetes mellitus ([name])  Please note: voice recognition software was used to produce this document, and typos may escape review. Please contact Dr. Sheppard Coil for any needed clarifications.    Follow up plan: Return in about 6 months (around 03/16/2020) for [place] (get labs prior to visit, orders are in).

## 2020-09-09 ENCOUNTER — Other Ambulatory Visit: Payer: Self-pay | Admitting: Osteopathic Medicine

## 2020-09-09 DIAGNOSIS — E1165 Type 2 diabetes mellitus with hyperglycemia: Secondary | ICD-10-CM

## 2020-09-09 DIAGNOSIS — I10 Essential (primary) hypertension: Secondary | ICD-10-CM

## 2020-10-03 ENCOUNTER — Other Ambulatory Visit: Payer: Self-pay | Admitting: Osteopathic Medicine

## 2020-10-03 DIAGNOSIS — E1165 Type 2 diabetes mellitus with hyperglycemia: Secondary | ICD-10-CM

## 2020-10-03 DIAGNOSIS — I10 Essential (primary) hypertension: Secondary | ICD-10-CM

## 2020-10-26 ENCOUNTER — Other Ambulatory Visit: Payer: Self-pay | Admitting: Osteopathic Medicine

## 2020-10-26 DIAGNOSIS — I10 Essential (primary) hypertension: Secondary | ICD-10-CM

## 2020-10-26 MED ORDER — TELMISARTAN 40 MG PO TABS: 40.0000 mg | ORAL_TABLET | Freq: Every day | ORAL | 0 refills | Status: DC

## 2020-11-03 ENCOUNTER — Encounter: Payer: Self-pay | Admitting: Osteopathic Medicine

## 2020-11-03 ENCOUNTER — Other Ambulatory Visit: Payer: Self-pay

## 2020-11-03 ENCOUNTER — Ambulatory Visit (INDEPENDENT_AMBULATORY_CARE_PROVIDER_SITE_OTHER): Payer: BC Managed Care – PPO | Admitting: Osteopathic Medicine

## 2020-11-03 VITALS — BP 150/92 | HR 61 | Temp 98.0°F | Wt 228.0 lb

## 2020-11-03 DIAGNOSIS — E1159 Type 2 diabetes mellitus with other circulatory complications: Secondary | ICD-10-CM | POA: Diagnosis not present

## 2020-11-03 DIAGNOSIS — Z8739 Personal history of other diseases of the musculoskeletal system and connective tissue: Secondary | ICD-10-CM | POA: Diagnosis not present

## 2020-11-03 DIAGNOSIS — Z23 Encounter for immunization: Secondary | ICD-10-CM | POA: Diagnosis not present

## 2020-11-03 DIAGNOSIS — I152 Hypertension secondary to endocrine disorders: Secondary | ICD-10-CM

## 2020-11-03 DIAGNOSIS — E1165 Type 2 diabetes mellitus with hyperglycemia: Secondary | ICD-10-CM | POA: Diagnosis not present

## 2020-11-03 LAB — POCT GLYCOSYLATED HEMOGLOBIN (HGB A1C): Hemoglobin A1C: 5.2 % (ref 4.0–5.6)

## 2020-11-03 NOTE — Progress Notes (Signed)
Andre Bradley is a 40 y.o. male who presents to  [facility] at [facility]  today, 11/03/20, seeking care for the following:  [name] Monitor DM2 . HTN not at goal      ASSESSMENT & PLAN with other pertinent findings:  The primary encounter diagnosis was Type 2 diabetes mellitus with hyperglycemia, without long-term current use of insulin (HCC). Diagnoses of Hypertension associated with diabetes (HCC), History of gout, and Need for influenza vaccination were also pertinent to this visit.   Results for orders placed or performed in visit on 11/03/20 (from the past 24 hour(s))  POCT HgB A1C     Status: None   Collection Time: 11/03/20  8:28 AM  Result Value Ref Range   Hemoglobin A1C 5.2 4.0 - 5.6 %   HbA1c POC (<> result, manual entry)     HbA1c, POC (prediabetic range)     HbA1c, POC (controlled diabetic range)      Patient notes room for improvement in diet/exercise, has noted some weight gain over the past year or so.  Blood pressure definitely a concern given greater than 140/90, close follow-up advised, patient will start measuring blood pressures at home, MyChart message sent to send in a week to touch base on numbers and let me know how things are looking.  If persistently higher than 140/90 we will definitely want to adjust medications   There are no Patient Instructions on file for this visit.  Orders Placed This Encounter  Procedures  . Flu Vaccine QUAD 6+ mos PF IM (Fluarix Quad PF)  . CBC  . COMPLETE METABOLIC PANEL WITH GFR  . Lipid panel  . Uric acid  . POCT HgB A1C    No orders of the defined types were placed in this encounter.      Follow-up instructions: Return in about 6 months (around 05/03/2021) for ANNUAL CHECK-UP - SEE [place] SOONER IF NEEDED, BP FOLLOW-UP BASED ON MYCHART MESSAGE IN A WEEK .                                         BP (!) 150/92 (BP Location: Left Arm, Patient  Position: Sitting, Cuff Size: Large)   Pulse 61   Temp 98 F (36.7 C) (Oral)   Wt 228 lb 0.6 oz (103.4 kg)   BMI 31.81 kg/m   Current Meds  Medication Sig  . atorvastatin (LIPITOR) 20 MG tablet Take 1 tablet (20 mg total) by mouth daily.  . metFORMIN (GLUCOPHAGE-XR) 500 MG 24 hr tablet TAKE 2 TABLETS (1,000 MG TOTAL) BY MOUTH DAILY WITH SUPPER.  [name] telmisartan (MICARDIS) 40 MG tablet Take 1 tablet (40 mg total) by mouth daily. Need office visit.    Results for orders placed or performed in visit on 11/03/20 (from the past 72 hour(s))  POCT HgB A1C     Status: None   Collection Time: 11/03/20  8:28 AM  Result Value Ref Range   Hemoglobin A1C 5.2 4.0 - 5.6 %   HbA1c POC (<> result, manual entry)     HbA1c, POC (prediabetic range)     HbA1c, POC (controlled diabetic range)      No results found.     All questions at time of visit were answered - patient instructed to contact office with any additional concerns or updates.  ER/RTC precautions were reviewed with the  patient as applicable.   Please note: voice recognition software was used to produce this document, and typos may escape review. Please contact Dr. Lyn Hollingshead for any needed clarifications.

## 2020-11-04 LAB — COMPLETE METABOLIC PANEL WITH GFR
AG Ratio: 1.9 (calc) (ref 1.0–2.5)
ALT: 83 U/L — ABNORMAL HIGH (ref 9–46)
AST: 55 U/L — ABNORMAL HIGH (ref 10–40)
Albumin: 4.9 g/dL (ref 3.6–5.1)
Alkaline phosphatase (APISO): 94 U/L (ref 36–130)
BUN: 10 mg/dL (ref 7–25)
CO2: 28 mmol/L (ref 20–32)
Calcium: 9.9 mg/dL (ref 8.6–10.3)
Chloride: 101 mmol/L (ref 98–110)
Creat: 0.99 mg/dL (ref 0.60–1.35)
GFR, Est African American: 112 mL/min/{1.73_m2} (ref >=60)
GFR, Est Non African American: 97 mL/min/{1.73_m2} (ref >=60)
Globulin: 2.6 g/dL (calc) (ref 1.9–3.7)
Glucose, Bld: 96 mg/dL (ref 65–99)
Potassium: 4.4 mmol/L (ref 3.5–5.3)
Sodium: 136 mmol/L (ref 135–146)
Total Bilirubin: 0.8 mg/dL (ref 0.2–1.2)
Total Protein: 7.5 g/dL (ref 6.1–8.1)

## 2020-11-04 LAB — CBC
HCT: 47.3 % (ref 38.5–50.0)
Hemoglobin: 16.7 g/dL (ref 13.2–17.1)
MCH: 33.3 pg — ABNORMAL HIGH (ref 27.0–33.0)
MCHC: 35.3 g/dL (ref 32.0–36.0)
MCV: 94.4 fL (ref 80.0–100.0)
MPV: 10.9 fL (ref 7.5–12.5)
Platelets: 242 10*3/uL (ref 140–400)
RBC: 5.01 10*6/uL (ref 4.20–5.80)
RDW: 13.3 % (ref 11.0–15.0)
WBC: 7.1 10*3/uL (ref 3.8–10.8)

## 2020-11-04 LAB — LIPID PANEL
Cholesterol: 178 mg/dL (ref <200)
HDL: 42 mg/dL (ref >=40)
LDL Cholesterol (Calc): 85 mg/dL (calc)
Non-HDL Cholesterol (Calc): 136 mg/dL (calc) — ABNORMAL HIGH (ref <130)
Total CHOL/HDL Ratio: 4.2 (calc) (ref <5.0)
Triglycerides: 384 mg/dL — ABNORMAL HIGH (ref <150)

## 2020-11-04 LAB — URIC ACID: Uric Acid, Serum: 7.4 mg/dL (ref 4.0–8.0)

## 2020-11-05 ENCOUNTER — Other Ambulatory Visit: Payer: Self-pay | Admitting: Osteopathic Medicine

## 2020-11-05 DIAGNOSIS — I10 Essential (primary) hypertension: Secondary | ICD-10-CM

## 2020-11-15 ENCOUNTER — Other Ambulatory Visit: Payer: Self-pay | Admitting: Osteopathic Medicine

## 2020-11-15 DIAGNOSIS — I10 Essential (primary) hypertension: Secondary | ICD-10-CM

## 2020-12-06 ENCOUNTER — Other Ambulatory Visit: Payer: Self-pay | Admitting: Osteopathic Medicine

## 2020-12-06 DIAGNOSIS — E1169 Type 2 diabetes mellitus with other specified complication: Secondary | ICD-10-CM

## 2020-12-06 DIAGNOSIS — E782 Mixed hyperlipidemia: Secondary | ICD-10-CM

## 2021-03-26 ENCOUNTER — Other Ambulatory Visit: Payer: Self-pay | Admitting: Osteopathic Medicine

## 2021-03-26 DIAGNOSIS — E1165 Type 2 diabetes mellitus with hyperglycemia: Secondary | ICD-10-CM

## 2021-04-26 ENCOUNTER — Other Ambulatory Visit: Payer: Self-pay | Admitting: Osteopathic Medicine

## 2021-04-26 DIAGNOSIS — E1165 Type 2 diabetes mellitus with hyperglycemia: Secondary | ICD-10-CM

## 2021-05-21 ENCOUNTER — Other Ambulatory Visit: Payer: Self-pay | Admitting: Osteopathic Medicine

## 2021-05-21 DIAGNOSIS — E1165 Type 2 diabetes mellitus with hyperglycemia: Secondary | ICD-10-CM

## 2021-06-17 ENCOUNTER — Other Ambulatory Visit: Payer: Self-pay | Admitting: Osteopathic Medicine

## 2021-06-17 DIAGNOSIS — E1165 Type 2 diabetes mellitus with hyperglycemia: Secondary | ICD-10-CM

## 2021-06-18 ENCOUNTER — Other Ambulatory Visit: Payer: Self-pay | Admitting: Osteopathic Medicine

## 2021-06-18 DIAGNOSIS — I10 Essential (primary) hypertension: Secondary | ICD-10-CM

## 2021-06-30 ENCOUNTER — Other Ambulatory Visit: Payer: Self-pay | Admitting: Osteopathic Medicine

## 2021-06-30 DIAGNOSIS — E1165 Type 2 diabetes mellitus with hyperglycemia: Secondary | ICD-10-CM

## 2021-07-22 ENCOUNTER — Other Ambulatory Visit: Payer: Self-pay | Admitting: Osteopathic Medicine

## 2021-07-22 DIAGNOSIS — I10 Essential (primary) hypertension: Secondary | ICD-10-CM

## 2021-07-28 ENCOUNTER — Other Ambulatory Visit: Payer: Self-pay | Admitting: Osteopathic Medicine

## 2021-07-28 DIAGNOSIS — E1165 Type 2 diabetes mellitus with hyperglycemia: Secondary | ICD-10-CM

## 2021-08-07 ENCOUNTER — Other Ambulatory Visit: Payer: Self-pay | Admitting: Sports Medicine

## 2021-08-07 DIAGNOSIS — I10 Essential (primary) hypertension: Secondary | ICD-10-CM

## 2021-08-15 ENCOUNTER — Other Ambulatory Visit: Payer: Self-pay | Admitting: Physician Assistant

## 2021-08-15 DIAGNOSIS — E1165 Type 2 diabetes mellitus with hyperglycemia: Secondary | ICD-10-CM

## 2021-08-15 NOTE — Telephone Encounter (Signed)
Left voicemail message for patient to call back to get this appointment scheduled. AM 

## 2021-08-15 NOTE — Telephone Encounter (Signed)
Patient needs appt to establish care with another provider. Needs appt before we fill any prescriptions.

## 2021-11-04 ENCOUNTER — Telehealth: Payer: Self-pay | Admitting: Osteopathic Medicine

## 2021-11-04 DIAGNOSIS — I10 Essential (primary) hypertension: Secondary | ICD-10-CM

## 2021-11-04 MED ORDER — TELMISARTAN 40 MG PO TABS: 40.0000 mg | ORAL_TABLET | Freq: Every day | ORAL | 0 refills | Status: DC

## 2021-11-04 NOTE — Telephone Encounter (Signed)
Patient has made a transfer of care visit with Dr Ashley Royalty on 12/05/2021 and needed a refill of medication up until his appointment date. Please advise. AM Routing to covering provider.   telmisartan (MICARDIS) 40 MG tablet

## 2021-11-04 NOTE — Telephone Encounter (Signed)
Prescription sent for 30 days. Patient advised.

## 2021-12-03 ENCOUNTER — Other Ambulatory Visit: Payer: Self-pay | Admitting: Family Medicine

## 2021-12-03 DIAGNOSIS — I10 Essential (primary) hypertension: Secondary | ICD-10-CM

## 2021-12-05 ENCOUNTER — Ambulatory Visit: Payer: BC Managed Care – PPO | Admitting: Family Medicine

## 2021-12-20 ENCOUNTER — Ambulatory Visit: Payer: BC Managed Care – PPO | Admitting: Family Medicine

## 2022-02-06 ENCOUNTER — Ambulatory Visit (INDEPENDENT_AMBULATORY_CARE_PROVIDER_SITE_OTHER): Payer: BC Managed Care – PPO | Admitting: Family Medicine

## 2022-02-06 ENCOUNTER — Encounter: Payer: Self-pay | Admitting: Family Medicine

## 2022-02-06 VITALS — BP 161/82 | HR 73 | Ht 71.0 in | Wt 244.0 lb

## 2022-02-06 DIAGNOSIS — I10 Essential (primary) hypertension: Secondary | ICD-10-CM

## 2022-02-06 DIAGNOSIS — E1159 Type 2 diabetes mellitus with other circulatory complications: Secondary | ICD-10-CM | POA: Diagnosis not present

## 2022-02-06 DIAGNOSIS — E782 Mixed hyperlipidemia: Secondary | ICD-10-CM

## 2022-02-06 DIAGNOSIS — E1165 Type 2 diabetes mellitus with hyperglycemia: Secondary | ICD-10-CM

## 2022-02-06 DIAGNOSIS — E781 Pure hyperglyceridemia: Secondary | ICD-10-CM

## 2022-02-06 DIAGNOSIS — E1169 Type 2 diabetes mellitus with other specified complication: Secondary | ICD-10-CM

## 2022-02-06 DIAGNOSIS — I152 Hypertension secondary to endocrine disorders: Secondary | ICD-10-CM

## 2022-02-06 MED ORDER — ATORVASTATIN CALCIUM 20 MG PO TABS: 20.0000 mg | ORAL_TABLET | Freq: Every day | ORAL | 3 refills | Status: DC

## 2022-02-06 MED ORDER — TELMISARTAN 40 MG PO TABS: 40.0000 mg | ORAL_TABLET | Freq: Every day | ORAL | 3 refills | Status: DC

## 2022-02-06 MED ORDER — METFORMIN HCL ER 500 MG PO TB24: ORAL_TABLET | ORAL | 3 refills | Status: DC

## 2022-02-06 NOTE — Assessment & Plan Note (Signed)
BP elevated today but has been off medication for at least 1 week.  telmisartan renewed.  Updating labs.  Low sodium diet recommended.  ?

## 2022-02-06 NOTE — Assessment & Plan Note (Signed)
Updating a1c.  Metformin renewed.  ?

## 2022-02-06 NOTE — Assessment & Plan Note (Signed)
Taking atorvastatin.  Update lipid panel today.  ?

## 2022-02-06 NOTE — Progress Notes (Signed)
?TRESEAN MATTIX - 41 y.o. male MRN [id number]  Date of birth: [birth year] ? ?Subjective ?Chief Complaint  ?Patient presents with  ? Transitions Of Care  ? ? ?HPI ?"JT" is a 41 y.o. male here today for follow up visit.  He is a former patient of Dr. Lyn Hollingshead.  He has been out medication for blood pressure for about 1 week.   BP is elevated today.  He denies symptoms related to HTN including chest pain, shortness of breath, palpitations, headache or vision changes.  ? ?He reports that he has been taking metformin for management of his diabetes.  Tolerating well.  He does not monitor blood sugar at home.   He is due for updated labs.  ? ?Tolerating atorvastatin well for management of HLD.   ? ?ROS:  A comprehensive ROS was completed and negative except as noted per HPI ? ?Allergies  ?Allergen Reactions  ? Morphine And Related Other (See Comments)  ? ? ?Past Medical History:  ?Diagnosis Date  ? Gout   ? Hypertension   ? Obesity   ? ? ?Past Surgical History:  ?Procedure Laterality Date  ? FRACTURE SURGERY    ? left humerous, 1999  ? ? ?Social History  ? ?Socioeconomic History  ? Marital status: Single  ?  Spouse name: Not on file  ? Number of children: Not on file  ? Years of education: Not on file  ? Highest education level: Not on file  ?Occupational History  ? Not on file  ?Tobacco Use  ? Smoking status: Former  ?  Packs/day: 0.75  ?  Years: 15.00  ?  Pack years: 11.25  ?  Types: Cigarettes  ?  Quit date: 07/24/2018  ?  Years since quitting: 3.5  ? Smokeless tobacco: Never  ?Vaping Use  ? Vaping Use: Never used  ?Substance and Sexual Activity  ? Alcohol use: Yes  ?  Alcohol/week: 6.0 - 8.0 standard drinks  ?  Types: 6 - 8 Standard drinks or equivalent per week  ? Drug use: Never  ? Sexual activity: Yes  ?  Birth control/protection: None  ?Other Topics Concern  ? Not on file  ?Social History Narrative  ? Not on file  ? ?Social Determinants of Health  ? ?Financial Resource Strain: Not on file  ?Food Insecurity: Not on  file  ?Transportation Needs: Not on file  ?Physical Activity: Not on file  ?Stress: Not on file  ?Social Connections: Not on file  ? ? ?Family History  ?Family history unknown: Yes  ? ? ?Health Maintenance  ?Topic Date Due  ? HEMOGLOBIN A1C  05/03/2021  ? OPHTHALMOLOGY EXAM  05/23/2022 (Originally 08/09/1993)  ? COVID-19 Vaccine (1) 06/23/2022 (Originally 02/08/1984)  ? Hepatitis C Screening  02/07/2023 (Originally 08/09/2001)  ? HIV Screening  02/07/2023 (Originally 08/09/1998)  ? INFLUENZA VACCINE  05/23/2022  ? TETANUS/TDAP  05/23/2022  ? FOOT EXAM  02/07/2023  ? HPV VACCINES  Aged Out  ? ? ? ?----------------------------------------------------------------------------------------------------------------------------------------------------------------------------------------------------------------- ?Physical Exam ?BP (!) 161/82 (BP Location: Left Arm, Patient Position: Sitting, Cuff Size: Large)   Pulse 73   Ht 5\' 11"  (1.803 m)   Wt 244 lb (110.7 kg)   SpO2 96%   BMI 34.03 kg/m?  ? ?Physical Exam ?Constitutional:   ?   Appearance: Normal appearance.  ?HENT:  ?   Head: Normocephalic and atraumatic.  ?Eyes:  ?   General: No scleral icterus. ?Cardiovascular:  ?   Rate and Rhythm: Normal rate and regular rhythm.  ?  Pulmonary:  ?   Effort: Pulmonary effort is normal.  ?   Breath sounds: Normal breath sounds.  ?Musculoskeletal:  ?   Cervical back: Neck supple.  ?Neurological:  ?   Mental Status: He is alert.  ?Psychiatric:     ?   Mood and Affect: Mood normal.     ?   Behavior: Behavior normal.  ? ? ?------------------------------------------------------------------------------------------------------------------------------------------------------------------------------------------------------------------- ?Assessment and Plan ? ?Hypertension associated with diabetes (HCC) ?BP elevated today but has been off medication for at least 1 week.  telmisartan renewed.  Updating labs.  Low sodium diet recommended.   ? ?Type 2 diabetes mellitus with hyperglycemia (HCC) ?Updating a1c.  Metformin renewed.  ? ?Hypertriglyceridemia ?Taking atorvastatin.  Update lipid panel today.  ? ? ?Meds ordered this encounter  ?Medications  ? atorvastatin (LIPITOR) 20 MG tablet  ?  Sig: Take 1 tablet (20 mg total) by mouth daily.  ?  Dispense:  90 tablet  ?  Refill:  3  ? metFORMIN (GLUCOPHAGE-XR) 500 MG 24 hr tablet  ?  Sig: TAKE 2 TABLETS BY MOUTH DAILY WITH SUPPER  ?  Dispense:  180 tablet  ?  Refill:  3  ? telmisartan (MICARDIS) 40 MG tablet  ?  Sig: Take 1 tablet (40 mg total) by mouth daily.  ?  Dispense:  90 tablet  ?  Refill:  3  ? ? ?Return in about 6 months (around 08/08/2022) for HTN/T2DM. ? ? ? ?This visit occurred during the SARS-CoV-2 public health emergency.  Safety protocols were in place, including screening questions prior to the visit, additional usage of staff PPE, and extensive cleaning of exam room while observing appropriate contact time as indicated for disinfecting solutions.  ? ?

## 2022-02-06 NOTE — Patient Instructions (Signed)
Great to meet you today! We'll be in touch with lab results.  

## 2022-02-13 LAB — CBC
HCT: 45.5 % (ref 38.5–50.0)
Hemoglobin: 15.6 g/dL (ref 13.2–17.1)
MCH: 34 pg — ABNORMAL HIGH (ref 27.0–33.0)
MCHC: 34.3 g/dL (ref 32.0–36.0)
MCV: 99.1 fL (ref 80.0–100.0)
MPV: 11.8 fL (ref 7.5–12.5)
Platelets: 192 10*3/uL (ref 140–400)
RBC: 4.59 10*6/uL (ref 4.20–5.80)
RDW: 12.5 % (ref 11.0–15.0)
WBC: 9.5 10*3/uL (ref 3.8–10.8)

## 2022-02-13 LAB — T4, FREE

## 2022-02-13 LAB — COMPLETE METABOLIC PANEL WITH GFR
AG Ratio: 1.2 (calc) (ref 1.0–2.5)
ALT: 107 U/L — ABNORMAL HIGH (ref 9–46)
AST: 72 U/L — ABNORMAL HIGH (ref 10–40)
Albumin: 4 g/dL (ref 3.6–5.1)
Alkaline phosphatase (APISO): 159 U/L — ABNORMAL HIGH (ref 36–130)
BUN: 7 mg/dL (ref 7–25)
CO2: 23 mmol/L (ref 20–32)
Calcium: 9 mg/dL (ref 8.6–10.3)
Chloride: 99 mmol/L (ref 98–110)
Creat: 0.75 mg/dL (ref 0.60–1.26)
Globulin: 3.3 g/dL (calc) (ref 1.9–3.7)
Glucose, Bld: 211 mg/dL — ABNORMAL HIGH (ref 65–99)
Potassium: 4.4 mmol/L (ref 3.5–5.3)
Sodium: 133 mmol/L — ABNORMAL LOW (ref 135–146)
Total Bilirubin: 0.7 mg/dL (ref 0.2–1.2)
Total Protein: 7.3 g/dL (ref 6.1–8.1)
eGFR: 118 mL/min/{1.73_m2} (ref >=60)

## 2022-02-13 LAB — LIPID PANEL W/REFLEX DIRECT LDL
Cholesterol: 162 mg/dL (ref <200)
HDL: 32 mg/dL — ABNORMAL LOW (ref >=40)
LDL Cholesterol (Calc): 94 mg/dL (calc)
Non-HDL Cholesterol (Calc): 130 mg/dL (calc) — ABNORMAL HIGH (ref <130)
Total CHOL/HDL Ratio: 5.1 (calc) — ABNORMAL HIGH (ref <5.0)
Triglycerides: 239 mg/dL — ABNORMAL HIGH (ref <150)

## 2022-02-13 LAB — TSH: TSH: 6.33 mIU/L — ABNORMAL HIGH (ref 0.40–4.50)

## 2022-02-13 LAB — HEMOGLOBIN A1C
Hgb A1c MFr Bld: 7.5 % of total Hgb — ABNORMAL HIGH (ref <5.7)
Mean Plasma Glucose: 169 mg/dL
eAG (mmol/L): 9.3 mmol/L

## 2022-02-15 ENCOUNTER — Other Ambulatory Visit: Payer: Self-pay | Admitting: Family Medicine

## 2022-02-15 DIAGNOSIS — R7989 Other specified abnormal findings of blood chemistry: Secondary | ICD-10-CM

## 2022-03-06 ENCOUNTER — Ambulatory Visit: Payer: BC Managed Care – PPO

## 2022-04-18 ENCOUNTER — Other Ambulatory Visit: Payer: Self-pay | Admitting: Physician Assistant

## 2022-04-18 ENCOUNTER — Other Ambulatory Visit: Payer: Self-pay | Admitting: Osteopathic Medicine

## 2022-04-18 DIAGNOSIS — E1169 Type 2 diabetes mellitus with other specified complication: Secondary | ICD-10-CM

## 2022-04-18 DIAGNOSIS — E1165 Type 2 diabetes mellitus with hyperglycemia: Secondary | ICD-10-CM

## 2022-04-18 DIAGNOSIS — E782 Mixed hyperlipidemia: Secondary | ICD-10-CM

## 2022-04-26 ENCOUNTER — Other Ambulatory Visit: Payer: Self-pay | Admitting: Physician Assistant

## 2022-04-26 DIAGNOSIS — E1165 Type 2 diabetes mellitus with hyperglycemia: Secondary | ICD-10-CM

## 2022-05-20 ENCOUNTER — Other Ambulatory Visit: Payer: Self-pay | Admitting: Sports Medicine

## 2022-05-20 DIAGNOSIS — I10 Essential (primary) hypertension: Secondary | ICD-10-CM

## 2022-08-14 ENCOUNTER — Ambulatory Visit: Payer: BC Managed Care – PPO | Admitting: Family Medicine

## 2023-09-23 DEATH — deceased

## 2024-06-24 ENCOUNTER — Encounter: Payer: Self-pay | Admitting: Sports Medicine
